# Patient Record
Sex: Female | Born: 1960 | Race: Black or African American | Hispanic: No | Marital: Married | Smoking: Never smoker
Health system: Southern US, Community
[De-identification: ages and names within clinical notes are randomized; demographics above are authoritative.]

## PROBLEM LIST (undated history)

## (undated) DIAGNOSIS — K219 Gastro-esophageal reflux disease without esophagitis: Secondary | ICD-10-CM

## (undated) DIAGNOSIS — E669 Obesity, unspecified: Secondary | ICD-10-CM

## (undated) DIAGNOSIS — E119 Type 2 diabetes mellitus without complications: Secondary | ICD-10-CM

## (undated) DIAGNOSIS — E785 Hyperlipidemia, unspecified: Secondary | ICD-10-CM

## (undated) DIAGNOSIS — I10 Essential (primary) hypertension: Secondary | ICD-10-CM

## (undated) HISTORY — DX: Obesity, unspecified: E66.9

## (undated) HISTORY — DX: Gastro-esophageal reflux disease without esophagitis: K21.9

## (undated) HISTORY — DX: Hyperlipidemia, unspecified: E78.5

## (undated) HISTORY — PX: ABDOMINAL HYSTERECTOMY: SHX81

## (undated) HISTORY — DX: Type 2 diabetes mellitus without complications: E11.9

## (undated) HISTORY — DX: Essential (primary) hypertension: I10

---

## 2004-12-16 ENCOUNTER — Ambulatory Visit: Payer: Self-pay | Admitting: Obstetrics and Gynecology

## 2005-05-21 ENCOUNTER — Ambulatory Visit: Payer: Self-pay

## 2005-05-28 ENCOUNTER — Ambulatory Visit: Payer: Self-pay

## 2005-07-13 ENCOUNTER — Ambulatory Visit: Payer: Self-pay | Admitting: Obstetrics and Gynecology

## 2006-03-12 ENCOUNTER — Ambulatory Visit: Payer: Self-pay

## 2007-01-17 ENCOUNTER — Other Ambulatory Visit: Payer: Self-pay

## 2007-01-17 ENCOUNTER — Inpatient Hospital Stay: Payer: Self-pay | Admitting: Internal Medicine

## 2009-10-09 ENCOUNTER — Other Ambulatory Visit: Payer: Self-pay | Admitting: General Practice

## 2010-06-11 ENCOUNTER — Ambulatory Visit: Payer: Self-pay | Admitting: Family Medicine

## 2011-02-11 ENCOUNTER — Ambulatory Visit: Payer: Self-pay | Admitting: General Practice

## 2012-07-13 ENCOUNTER — Other Ambulatory Visit: Payer: Self-pay | Admitting: Physician Assistant

## 2012-07-13 LAB — HEPATIC FUNCTION PANEL A (ARMC)
Albumin: 3.9 g/dL (ref 3.4–5.0)
Alkaline Phosphatase: 124 U/L (ref 50–136)
SGOT(AST): 26 U/L (ref 15–37)
SGPT (ALT): 24 U/L (ref 12–78)
Total Protein: 7.4 g/dL (ref 6.4–8.2)

## 2014-04-04 DIAGNOSIS — I10 Essential (primary) hypertension: Secondary | ICD-10-CM

## 2014-04-04 DIAGNOSIS — E669 Obesity, unspecified: Secondary | ICD-10-CM

## 2014-04-04 HISTORY — DX: Essential (primary) hypertension: I10

## 2014-04-04 HISTORY — DX: Obesity, unspecified: E66.9

## 2014-05-03 ENCOUNTER — Ambulatory Visit: Payer: Self-pay | Admitting: Family Medicine

## 2014-06-01 ENCOUNTER — Ambulatory Visit: Payer: Self-pay | Admitting: Gastroenterology

## 2014-06-05 LAB — PATHOLOGY REPORT

## 2015-06-21 ENCOUNTER — Other Ambulatory Visit: Payer: Self-pay | Admitting: Internal Medicine

## 2015-06-21 DIAGNOSIS — Z1231 Encounter for screening mammogram for malignant neoplasm of breast: Secondary | ICD-10-CM

## 2015-06-27 ENCOUNTER — Ambulatory Visit: Payer: Self-pay | Attending: Internal Medicine

## 2016-02-20 ENCOUNTER — Ambulatory Visit: Payer: Self-pay

## 2016-02-28 ENCOUNTER — Ambulatory Visit
Admission: RE | Admit: 2016-02-28 | Discharge: 2016-02-28 | Disposition: A | Discharge status: Home / Self Care | Payer: BLUE CROSS/BLUE SHIELD | Source: Ambulatory Visit | Attending: Internal Medicine | Admitting: Internal Medicine

## 2016-02-28 DIAGNOSIS — Z1231 Encounter for screening mammogram for malignant neoplasm of breast: Secondary | ICD-10-CM | POA: Diagnosis not present

## 2016-03-12 ENCOUNTER — Ambulatory Visit (INDEPENDENT_AMBULATORY_CARE_PROVIDER_SITE_OTHER): Payer: BLUE CROSS/BLUE SHIELD | Admitting: Urology

## 2016-03-12 ENCOUNTER — Encounter: Payer: Self-pay | Admitting: Urology

## 2016-03-12 VITALS — BP 137/82 | HR 72 | Ht 64.0 in | Wt 195.5 lb

## 2016-03-12 DIAGNOSIS — R3129 Other microscopic hematuria: Secondary | ICD-10-CM | POA: Diagnosis not present

## 2016-03-12 NOTE — Progress Notes (Signed)
03/12/2016 2:44 PM   Christie Cox 1961-01-30 [id number]  Referring provider: No referring provider defined for this encounter.  Chief Complaint  Patient presents with  . New Patient (Initial Visit)    microscopic hematuria    HPI: The patient is a 55 year old female presents for evaluation of microscopic hematuria. She had 4-10 red blood cells per high-power field on recent urinalysis at her PCPs office. It is also present on today's urinalysis. She denies any history prior to this of microhematuria or gross hematuria. She is no history of nephrolithiasis. She has never smoked. She denies any issues with voiding.   PMH: Past Medical History  Diagnosis Date  . BP (high blood pressure) 04/04/2014  . Adiposity 04/04/2014  . Acid reflux     Surgical History: No past surgical history on file.  Home Medications:    Medication List       This list is accurate as of: 03/12/16  2:44 PM.  Always use your most recent med list.               losartan-hydrochlorothiazide 100-12.5 MG tablet  Commonly known as:  HYZAAR     montelukast 10 MG tablet  Commonly known as:  SINGULAIR  Take by mouth.        Allergies:  Allergies  Allergen Reactions  . Beta Adrenergic Blockers Other (See Comments)    Lack of energy  . Triamterene-Hctz     Other reaction(s): Other (See Comments) malaise    Family History: No family history on file.  Social History:  reports that she has never smoked. She does not have any smokeless tobacco history on file. She reports that she drinks alcohol. She reports that she does not use illicit drugs.  ROS: UROLOGY Frequent Urination?: Yes Hard to postpone urination?: No Burning/pain with urination?: No Get up at night to urinate?: Yes Leakage of urine?: No Urine stream starts and stops?: No Trouble starting stream?: No Do you have to strain to urinate?: No Blood in urine?: Yes Urinary tract infection?: No Sexually transmitted disease?:  No Injury to kidneys or bladder?: No Painful intercourse?: No Weak stream?: No Currently pregnant?: No Vaginal bleeding?: No Last menstrual period?: n  Gastrointestinal Nausea?: No Vomiting?: No Indigestion/heartburn?: Yes Diarrhea?: No Constipation?: No  Constitutional Fever: No Night sweats?: No Weight loss?: No Fatigue?: No  Skin Skin rash/lesions?: No Itching?: No  Eyes Blurred vision?: No Double vision?: No  Ears/Nose/Throat Sore throat?: No Sinus problems?: No  Hematologic/Lymphatic Swollen glands?: No Easy bruising?: Yes  Cardiovascular Leg swelling?: No Chest pain?: No  Respiratory Cough?: No Shortness of breath?: No  Endocrine Excessive thirst?: No  Musculoskeletal Back pain?: Yes Joint pain?: No  Neurological Headaches?: No Dizziness?: No  Psychologic Depression?: No Anxiety?: No  Physical Exam: BP 137/82 mmHg  Pulse 72  Ht  (1.626 m)  Wt 195 lb 8 oz (88.678 kg)  BMI 33.54 kg/m2  Constitutional:  Alert and oriented, No acute distress. HEENT: [place] AT, moist mucus membranes.  Trachea midline, no masses. Cardiovascular: No clubbing, cyanosis, or edema. Respiratory: Normal respiratory effort, no increased work of breathing. GI: Abdomen is soft, nontender, nondistended, no abdominal masses GU: No CVA tenderness. Skin: No rashes, bruises or suspicious lesions. Lymph: No cervical or inguinal adenopathy. Neurologic: Grossly intact, no focal deficits, moving all 4 extremities. Psychiatric: Normal mood and affect.  Laboratory Data: No results found for: WBC, HGB, HCT, MCV, PLT  No results found for: CREATININE  No results  found for: PSA  No results found for: TESTOSTERONE  No results found for: HGBA1C  Urinalysis No results found for: COLORURINE, APPEARANCEUR, LABSPEC, PHURINE, GLUCOSEU, HGBUR, BILIRUBINUR, KETONESUR, PROTEINUR, UROBILINOGEN, NITRITE, LEUKOCYTESUR   Assessment & Plan:    I discussed with the patient  possible etiologies of her microscopic hematuria which include urinary tract infection, nephrolithiasis, and tumors. We discussed the normal workup for this involved a CT urogram followed by office cystoscopy. All questions were answered and the patient elected to proceed.  1. Microscopic hematuria -CT Urogram -follow up for office cystoscopy after above    Return in about 2 weeks (around 03/26/2016) for after CT Urogram for cystoscopy.  Hildred LaserBrian James Terelle Dobler, MD  [facility] [address], [address] [place], [place] [address] [phone]

## 2016-03-25 ENCOUNTER — Telehealth: Payer: Self-pay | Admitting: Radiology

## 2016-03-25 NOTE — Telephone Encounter (Signed)
Pt called to cancel appt. Does not want to r/s. Not seeing another urologist.

## 2016-04-08 ENCOUNTER — Other Ambulatory Visit: Payer: BLUE CROSS/BLUE SHIELD

## 2016-04-22 LAB — URINALYSIS, COMPLETE
BILIRUBIN UA: NEGATIVE
Glucose, UA: NEGATIVE
KETONES UA: NEGATIVE
LEUKOCYTES UA: NEGATIVE
NITRITE UA: NEGATIVE
PH UA: 5.5 (ref 5.0–7.5)
Urobilinogen, Ur: 0.2 mg/dL (ref 0.2–1.0)

## 2016-04-22 LAB — MICROSCOPIC EXAMINATION

## 2017-09-24 ENCOUNTER — Other Ambulatory Visit: Payer: Self-pay | Admitting: Internal Medicine

## 2017-09-24 DIAGNOSIS — Z1231 Encounter for screening mammogram for malignant neoplasm of breast: Secondary | ICD-10-CM

## 2017-10-14 ENCOUNTER — Encounter: Payer: Self-pay | Admitting: Urology

## 2017-10-14 ENCOUNTER — Ambulatory Visit: Payer: BLUE CROSS/BLUE SHIELD | Admitting: Urology

## 2017-10-14 VITALS — BP 154/97 | HR 79 | Ht 64.0 in | Wt 194.8 lb

## 2017-10-14 DIAGNOSIS — R3129 Other microscopic hematuria: Secondary | ICD-10-CM | POA: Diagnosis not present

## 2017-10-14 LAB — URINALYSIS, COMPLETE
BILIRUBIN UA: NEGATIVE
Glucose, UA: NEGATIVE
KETONES UA: NEGATIVE
LEUKOCYTES UA: NEGATIVE
Nitrite, UA: NEGATIVE
SPEC GRAV UA: 1.025 (ref 1.005–1.030)
Urobilinogen, Ur: 0.2 mg/dL (ref 0.2–1.0)
pH, UA: 5.5 (ref 5.0–7.5)

## 2017-10-14 LAB — MICROSCOPIC EXAMINATION: WBC, UA: NONE SEEN /hpf (ref 0–?)

## 2017-10-14 NOTE — Progress Notes (Signed)
10/14/2017 3:55 PM   Christie Cox 04/03/1961 [id number]  Referring provider: Barbette Reichmann, MD [address] [facility] [place], [place] [address]  Chief Complaint  Patient presents with  . Hematuria    HPI: The patient is a 57 year old female presents for repeat evaluation of microscopic hematuria.  She was originally seen for this in June 2017 where microscopic hematuria workup with CT and bone scan was recommended.  The patient decided not to follow-up for this.  She returns today with persistent microscopic hematuria.  She continues to have microscopic hematuria now and at least 4 samples with the most recent being in December 2018.  She denies any history of microhematuria or gross hematuria prior to her first visit. She is no history of nephrolithiasis. She has never smoked. She denies any issues with voiding.     PMH: Past Medical History:  Diagnosis Date  . Acid reflux   . Adiposity 04/04/2014  . BP (high blood pressure) 04/04/2014    Surgical History: None  Home Medications:  Allergies as of 10/14/2017      Reactions   Beta Adrenergic Blockers Other (See Comments)   Lack of energy   Triamterene-hctz    Other reaction(s): Other (See Comments) malaise      Medication List        Accurate as of 10/14/17  3:55 PM. Always use your most recent med list.          DENAVIR 1 % cream Generic drug:  penciclovir Apply topically.   fenofibrate 48 MG tablet Commonly known as:  TRICOR Take by mouth.   losartan-hydrochlorothiazide 100-12.5 MG tablet Commonly known as:  HYZAAR   montelukast 10 MG tablet Commonly known as:  SINGULAIR Take by mouth.   omeprazole 20 MG capsule Commonly known as:  PRILOSEC Take by mouth.   PROVENTIL HFA 108 (90 Base) MCG/ACT inhaler Generic drug:  albuterol Inhale into the lungs.       Allergies:  Allergies  Allergen Reactions  . Beta Adrenergic Blockers Other (See Comments)    Lack of energy  .  Triamterene-Hctz     Other reaction(s): Other (See Comments) malaise    Family History: Family History  Problem Relation Age of Onset  . Bladder Cancer Neg Hx   . Kidney cancer Neg Hx     Social History:  reports that  has never smoked. she has never used smokeless tobacco. She reports that she drinks alcohol. She reports that she does not use drugs.  ROS: UROLOGY Frequent Urination?: No Hard to postpone urination?: No Burning/pain with urination?: No Get up at night to urinate?: No Leakage of urine?: No Urine stream starts and stops?: No Trouble starting stream?: No Do you have to strain to urinate?: No Blood in urine?: Yes Urinary tract infection?: No Sexually transmitted disease?: No Injury to kidneys or bladder?: No Painful intercourse?: No Weak stream?: No Currently pregnant?: No Vaginal bleeding?: No Last menstrual period?: n  Gastrointestinal Nausea?: No Vomiting?: No Indigestion/heartburn?: No Diarrhea?: No Constipation?: No  Constitutional Fever: No Night sweats?: No Weight loss?: No Fatigue?: No  Skin Skin rash/lesions?: No Itching?: No  Eyes Blurred vision?: No Double vision?: No  Ears/Nose/Throat Sore throat?: No Sinus problems?: No  Hematologic/Lymphatic Swollen glands?: No Easy bruising?: No  Cardiovascular Leg swelling?: No Chest pain?: No  Respiratory Cough?: No Shortness of breath?: No  Endocrine Excessive thirst?: No  Musculoskeletal Back pain?: No Joint pain?: No  Neurological Headaches?: No Dizziness?: No  Psychologic Depression?: No Anxiety?: No  Physical Exam: BP (!) 154/97 (BP Location: Right Arm, Patient Position: Sitting, Cuff Size: Large)   Pulse 79   Ht 5\' 4"  (1.626 m)   Wt 194 lb 12.8 oz (88.4 kg)   BMI 33.44 kg/m   Constitutional:  Alert and oriented, No acute distress. HEENT: [place] AT, moist mucus membranes.  Trachea midline, no masses. Cardiovascular: No clubbing, cyanosis, or  edema. Respiratory: Normal respiratory effort, no increased work of breathing. GI: Abdomen is soft, nontender, nondistended, no abdominal masses GU: No CVA tenderness.  Skin: No rashes, bruises or suspicious lesions. Lymph: No cervical or inguinal adenopathy. Neurologic: Grossly intact, no focal deficits, moving all 4 extremities. Psychiatric: Normal mood and affect.  Laboratory Data: No results found for: WBC, HGB, HCT, MCV, PLT  No results found for: CREATININE  No results found for: PSA  No results found for: TESTOSTERONE  No results found for: HGBA1C  Urinalysis    Component Value Date/Time   APPEARANCEUR Clear 03/12/2016 1424   GLUCOSEU Negative 03/12/2016 1424   BILIRUBINUR Negative 03/12/2016 1424   PROTEINUR Trace (A) 03/12/2016 1424   NITRITE Negative 03/12/2016 1424   LEUKOCYTESUR Negative 03/12/2016 1424    Assessment & Plan:    1.  I again discussed the patient the importance of undergoing a true microscopic hematuria workup in the American urological Association guidelines for this which she clearly meets.  I have strongly urged her to continue follow-up this time with CT urogram followed by office cystoscopy.  Return for Cysto after CT urogram.  Hildred LaserBrian James Yolette Hastings, MD  [facility] [address], [address] [place], [place] [address] [phone]

## 2017-10-29 ENCOUNTER — Other Ambulatory Visit: Payer: BLUE CROSS/BLUE SHIELD

## 2017-11-01 ENCOUNTER — Ambulatory Visit
Admission: RE | Admit: 2017-11-01 | Discharge: 2017-11-01 | Disposition: A | Discharge status: Home / Self Care | Payer: BLUE CROSS/BLUE SHIELD | Source: Ambulatory Visit | Attending: Internal Medicine | Admitting: Internal Medicine

## 2017-11-01 DIAGNOSIS — Z1231 Encounter for screening mammogram for malignant neoplasm of breast: Secondary | ICD-10-CM | POA: Insufficient documentation

## 2017-11-04 ENCOUNTER — Ambulatory Visit
Admission: RE | Admit: 2017-11-04 | Discharge: 2017-11-04 | Disposition: A | Discharge status: Home / Self Care | Payer: BLUE CROSS/BLUE SHIELD | Source: Ambulatory Visit | Attending: Urology | Admitting: Urology

## 2017-11-04 DIAGNOSIS — K760 Fatty (change of) liver, not elsewhere classified: Secondary | ICD-10-CM | POA: Insufficient documentation

## 2017-11-04 DIAGNOSIS — K449 Diaphragmatic hernia without obstruction or gangrene: Secondary | ICD-10-CM | POA: Diagnosis not present

## 2017-11-04 DIAGNOSIS — I2510 Atherosclerotic heart disease of native coronary artery without angina pectoris: Secondary | ICD-10-CM | POA: Diagnosis not present

## 2017-11-04 DIAGNOSIS — R3129 Other microscopic hematuria: Secondary | ICD-10-CM | POA: Insufficient documentation

## 2017-11-04 DIAGNOSIS — Z90710 Acquired absence of both cervix and uterus: Secondary | ICD-10-CM | POA: Insufficient documentation

## 2017-11-04 DIAGNOSIS — K5730 Diverticulosis of large intestine without perforation or abscess without bleeding: Secondary | ICD-10-CM | POA: Insufficient documentation

## 2017-11-04 DIAGNOSIS — I700 Atherosclerosis of aorta: Secondary | ICD-10-CM | POA: Diagnosis not present

## 2017-11-04 MED ORDER — IOPAMIDOL (ISOVUE-300) INJECTION 61%
125.0000 mL | Freq: Once | INTRAVENOUS | Status: AC | PRN
  Administered 2017-11-04: 125 mL via INTRAVENOUS

## 2017-11-11 ENCOUNTER — Ambulatory Visit (INDEPENDENT_AMBULATORY_CARE_PROVIDER_SITE_OTHER): Payer: BLUE CROSS/BLUE SHIELD | Admitting: Urology

## 2017-11-11 ENCOUNTER — Encounter: Payer: Self-pay | Admitting: Urology

## 2017-11-11 VITALS — BP 133/89 | HR 80 | Ht 64.0 in | Wt 193.6 lb

## 2017-11-11 DIAGNOSIS — R3129 Other microscopic hematuria: Secondary | ICD-10-CM | POA: Diagnosis not present

## 2017-11-11 LAB — URINALYSIS, COMPLETE
BILIRUBIN UA: NEGATIVE
GLUCOSE, UA: NEGATIVE
KETONES UA: NEGATIVE
LEUKOCYTES UA: NEGATIVE
Nitrite, UA: NEGATIVE
SPEC GRAV UA: 1.025 (ref 1.005–1.030)
Urobilinogen, Ur: 0.2 mg/dL (ref 0.2–1.0)
pH, UA: 5.5 (ref 5.0–7.5)

## 2017-11-11 LAB — MICROSCOPIC EXAMINATION

## 2017-11-11 MED ORDER — LIDOCAINE HCL 2 % EX GEL
1.0000 "application " | Freq: Once | CUTANEOUS | Status: AC
  Administered 2017-11-11: 1 via URETHRAL

## 2017-11-11 MED ORDER — CIPROFLOXACIN HCL 500 MG PO TABS
500.0000 mg | ORAL_TABLET | Freq: Once | ORAL | Status: AC
  Administered 2017-11-11: 500 mg via ORAL

## 2017-11-11 NOTE — Progress Notes (Signed)
   11/11/17  CC:  Chief Complaint  Patient presents with  . Cysto    HPI: The patient is a 57 year old female presents today for completion of her microscopic hematuria workup.  Her CT hematuria workup was negative for source of hematuria.  There were no vitals taken for this visit. NED. A&Ox3.   No respiratory distress   Abd soft, NT, ND Normal external genitalia with patent urethral meatus  Cystoscopy Procedure Note  Patient identification was confirmed, informed consent was obtained, and patient was prepped using Betadine solution.  Lidocaine jelly was administered per urethral meatus.    Preoperative abx where received prior to procedure.    Procedure: - Flexible cystoscope introduced, without any difficulty.   - Thorough search of the bladder revealed:    normal urethral meatus    normal urothelium    no stones    no ulcers     no tumors    no urethral polyps    no trabeculation  - Ureteral orifices were normal in position and appearance.  Post-Procedure: - Patient tolerated the procedure well  Assessment/ Plan:  1.  Microscopic hematuria Negative workup.  Follow-up in 1 year for repeat urinalysis.  Hildred LaserBrian James Sacred Roa, MD

## 2018-06-28 ENCOUNTER — Other Ambulatory Visit: Payer: BLUE CROSS/BLUE SHIELD

## 2018-06-28 NOTE — H&P (Signed)
Christie Cox is a 57 y.o. female here for Discuss surgery .pt is new pt and is concerned about falling tissue for 3-4 months .  G3P3 SVD . + sexually active . No urination or Bowel issues . S/P [facility] 2006 for fibroid / bleeding      Past Medical History:  has a past medical history of Acute stress reaction, Benign hypertension, Obesity, unspecified, and Polyp of colon (06/01/14).  Past Surgical History:  has a past surgical history that includes Colonoscopy (06/01/14); LSH still has ovaries; and Hysterectomy (2005). Family History: family history includes ESRD-Dialysis in her sister; Glaucoma in her mother; Heart disease in her father and mother; High blood pressure (Hypertension) in her sister, sister, and sister; Rheumatic fever in her father. Social History:  reports that she has never smoked. She has never used smokeless tobacco. She reports that she drinks alcohol. She reports that she does not use drugs. OB/GYN History:          OB History    Gravida  3   Para  3   Term      Preterm      AB      Living  3     SAB      TAB      Ectopic      Molar      Multiple      Live Births  3          Allergies: is allergic to beta-blockers (beta-adrenergic blocking agts) and dyazide [triamterene-hydrochlorothiazid]. Medications:  Current Outpatient Medications:  .  albuterol 90 mcg/actuation inhaler, Inhale 2 inhalations into the lungs every 6 (six) hours as needed for Wheezing, Disp: 1 Inhaler, Rfl: 5 .  fenofibrate nanocrystallized (TRICOR) 48 MG tablet, Take 1 tablet (48 mg total) by mouth once daily, Disp: 30 tablet, Rfl: 5 .  losartan-hydrochlorothiazide (HYZAAR) 100-12.5 mg tablet, Take 1 tablet by mouth once daily, Disp: 90 tablet, Rfl: 3 .  montelukast (SINGULAIR) 10 mg tablet, Take 1 tablet (10 mg total) by mouth nightly, Disp: 90 tablet, Rfl: 3 .  nystatin-triamcinolone ointment, Apply topically 2 (two) times daily, Disp: 30 g, Rfl: 2 .  omeprazole  (PRILOSEC) 20 MG DR capsule, Take 1 capsule (20 mg total) by mouth 2 (two) times daily, Disp: 180 capsule, Rfl: 3 .  penciclovir (DENAVIR) 1 % cream, Apply 1 Application topically every 2 (two) hours while awake, Disp: 5 g, Rfl: 3 .  Compound Medication, Estriol 1 mg/g Use 1/4 app per vagina 3 times weekly Disp 30 g tube with 2 rf  Called to [facility] (Patient not taking: Reported on 05/02/2018 ), Disp: 1 each, Rfl: 2  Review of Systems: General:                      No fatigue or weight loss Eyes:                           No vision changes Ears:                            No hearing difficulty Respiratory:                No cough or shortness of breath Pulmonary:                  No asthma or shortness of breath Cardiovascular:  No chest pain, palpitations, dyspnea on exertion Gastrointestinal:          No abdominal bloating, chronic diarrhea, constipations, masses, pain or hematochezia Genitourinary:             No hematuria, dysuria, abnormal vaginal discharge, pelvic pain, Menometrorrhagia Lymphatic:                   No swollen lymph nodes Musculoskeletal:         No muscle weakness Neurologic:                  No extremity weakness, syncope, seizure disorder Psychiatric:                  No history of depression, delusions or suicidal/homicidal ideation    Exam:      Vitals:   06/29/18  BP: 121/78  Pulse: 83    Body mass index is 33.13 kg/m.  WDWN black female in NAD  Lungs: CTA  CV: RRR without murmur  Breast:exam done in sitting and lying position : No dimpling or retraction, no dominant mass, no spontaneous discharge, no axillary adenopathy Neck: no thyromegaly Abdomen: soft , no mass, normal active bowel sounds, non-tender, no rebound tenderness Pelvic: tanner stage 5 ,  External genitalia: vulva /labia no lesions Urethra: no prolapse Vagina: normal physiologic d/c, second degree cystocele and second degree rectocele with valsalva . Cx with  second degree descensus with valsalva Cervix: no lesions, no cervical motion tenderness  Uterus:absent Adnexa:no mass, non-tender  Rectovaginal:    Impression:   The primary encounter diagnosis was Cystocele, midline. Diagnoses of Rectocele and S/p partial hysterectomy with remaining cervical stump were also pertinent to this visit.    Plan:   After repeat discussion the patient elects for L/S assisted trachelectomy , and anterior / posterior repair . Bilateral salpingectomy . She has been counseled regarding the risk of the procedure     Return if symptoms worsen or fail to improve, for preop.  Vilma PraderHOMAS JANSE Christie Slutsky, MD

## 2018-06-29 ENCOUNTER — Other Ambulatory Visit: Payer: Self-pay

## 2018-06-29 ENCOUNTER — Encounter
Admission: RE | Admit: 2018-06-29 | Discharge: 2018-06-29 | Disposition: A | Discharge status: Home / Self Care | Payer: BLUE CROSS/BLUE SHIELD | Source: Ambulatory Visit | Attending: Obstetrics and Gynecology | Admitting: Obstetrics and Gynecology

## 2018-06-29 DIAGNOSIS — I10 Essential (primary) hypertension: Secondary | ICD-10-CM | POA: Insufficient documentation

## 2018-06-29 DIAGNOSIS — Z01818 Encounter for other preprocedural examination: Secondary | ICD-10-CM | POA: Diagnosis not present

## 2018-06-29 DIAGNOSIS — R9431 Abnormal electrocardiogram [ECG] [EKG]: Secondary | ICD-10-CM | POA: Diagnosis not present

## 2018-06-29 LAB — CBC
HEMATOCRIT: 40.7 % (ref 35.0–47.0)
HEMOGLOBIN: 14.4 g/dL (ref 12.0–16.0)
MCH: 34.2 pg — AB (ref 26.0–34.0)
MCHC: 35.5 g/dL (ref 32.0–36.0)
MCV: 96.5 fL (ref 80.0–100.0)
Platelets: 173 10*3/uL (ref 150–440)
RBC: 4.22 MIL/uL (ref 3.80–5.20)
RDW: 12.9 % (ref 11.5–14.5)
WBC: 3.3 10*3/uL — AB (ref 3.6–11.0)

## 2018-06-29 LAB — BASIC METABOLIC PANEL
Anion gap: 13 (ref 5–15)
BUN: 12 mg/dL (ref 6–20)
CHLORIDE: 98 mmol/L (ref 98–111)
CO2: 25 mmol/L (ref 22–32)
Calcium: 9.3 mg/dL (ref 8.9–10.3)
Creatinine, Ser: 0.62 mg/dL (ref 0.44–1.00)
GFR calc non Af Amer: >60 mL/min (ref >60)
Glucose, Bld: 341 mg/dL — ABNORMAL HIGH (ref 70–99)
POTASSIUM: UNDETERMINED mmol/L (ref 3.5–5.1)
SODIUM: 136 mmol/L (ref 135–145)

## 2018-06-29 NOTE — Pre-Procedure Instructions (Signed)
FAXED LABS WITH NEED FOR REDRAW OF KT AND OPTIMIZATION OF GLU PRIOR TO SURGERY TO DR HANDE. AND FYI TO DR Feliberto GottronSCHERMERHORN. ALSO CALLED TO [facility] AND SPOKE WITH APRIL

## 2018-06-29 NOTE — Pre-Procedure Instructions (Signed)
AS REQUESTED, FAXED EKG TO DR HANDE WITH REQUEST TO CLEAR. FYI TO DR Feliberto GottronSCHERMERHORN

## 2018-06-29 NOTE — Pre-Procedure Instructions (Signed)
Patient's BP was 182/118 in PAT taken both electronic and manually. She stated it was the same in Dr. Francesca OmanSchermerhorn's office this morning. She has an appointment with Dr. Marcello FennelHande this afternoon at 3:00pm to discuss treatment.

## 2018-06-29 NOTE — Patient Instructions (Addendum)
Your procedure is scheduled on: Monday 07/04/18  Report to DAY SURGERY DEPARTMENT LOCATED ON 2ND FLOOR MEDICAL MALL ENTRANCE. To find out your arrival time please call [phone] between 1PM - 3PM on Friday 07/01/18  Remember: Instructions that are not followed completely may result in serious medical risk, up to and including death, or upon the discretion of your surgeon and anesthesiologist your surgery may need to be rescheduled.     _X__ 1. Do not eat food after midnight the night before your procedure.                 No gum chewing or hard candies. You may drink clear liquids up to 2 hours                 before you are scheduled to arrive for your surgery- DO not drink clear                 liquids within 2 hours of the start of your surgery.                 Clear Liquids include:  water, apple juice without pulp, clear carbohydrate                 drink such as Clearfast or Gatorade, Black Coffee or Tea (Do not add                 anything to coffee or tea).  __X__2.  On the morning of surgery brush your teeth with toothpaste and water, you may rinse your mouth with mouthwash if you wish.  Do not swallow any toothpaste of mouthwash.     _X__ 3.  No Alcohol for 24 hours before or after surgery.   _X__ 4.  Do Not Smoke or use e-cigarettes For 24 Hours Prior to Your Surgery.                 Do not use any chewable tobacco products for at least 6 hours prior to                 surgery.  ____  5.  Bring all medications with you on the day of surgery if instructed.   __X__  6.  Notify your doctor if there is any change in your medical condition      (cold, fever, infections).     Do not wear jewelry, make-up, hairpins, clips or nail polish. Do not wear lotions, powders, or perfumes. You may wear deodorant. Do not shave 48 hours prior to surgery. Men may shave face and neck. Do not bring valuables to the hospital.    [facility] is not responsible for any belongings or  valuables.  Contacts, dentures/partials or body piercings may not be worn into surgery. Bring a case for your contacts, glasses or hearing aids, a denture cup will be supplied. Leave your suitcase in the car. After surgery it may be brought to your room. For patients admitted to the hospital, discharge time is determined by your treatment team.   Patients discharged the day of surgery will not be allowed to drive home.   Please read over the following fact sheets that you were given:   MRSA Information  __X__ Take these medicines the morning of surgery with A SIP OF WATER:     1. omeprazole (PRILOSEC) 20 MG capsule  2. albuterol (PROVENTIL HFA) 108 (90 Base) MCG/ACT inhaler  3.   4.  5.  6.  __X__ Fleet Enema (as directed). Administer the enema at least 1 hour prior to leaving your home for the hospital.   __X__ Use CHG Soap as directed.  _ X___ Use inhalers on the day of surgery. Also bring the inhaler with you to the hospital on the morning of surgery.  __X__ Stop Anti-inflammatories 7 days before surgery such as Advil, Ibuprofen, Motrin, BC or Goodies Powder, Naprosyn, Naproxen, Aleve, Aspirin, Meloxicam. May take Tylenol if needed for pain or discomfort.   __X__ Stop all herbal supplements, fish oil or vitamin E until after surgery.

## 2018-06-30 LAB — TYPE AND SCREEN
ABO/RH(D): A POS
Antibody Screen: POSITIVE
PT AG TYPE: NEGATIVE

## 2018-06-30 NOTE — Pre-Procedure Instructions (Signed)
Kt 4.3 at pcp office 06/29/18

## 2018-07-04 ENCOUNTER — Ambulatory Visit
Admission: RE | Admit: 2018-07-04 | Payer: BLUE CROSS/BLUE SHIELD | Source: Ambulatory Visit | Admitting: Obstetrics and Gynecology

## 2018-07-04 ENCOUNTER — Encounter: Admission: RE | Payer: Self-pay | Source: Ambulatory Visit

## 2018-07-04 SURGERY — TRACHELECTOMY
Anesthesia: General

## 2018-07-15 ENCOUNTER — Encounter: Payer: Self-pay | Admitting: *Deleted

## 2018-07-15 ENCOUNTER — Encounter: Payer: BLUE CROSS/BLUE SHIELD | Attending: Internal Medicine | Admitting: *Deleted

## 2018-07-15 VITALS — BP 130/84 | Ht 64.0 in | Wt 183.8 lb

## 2018-07-15 DIAGNOSIS — Z6831 Body mass index (BMI) 31.0-31.9, adult: Secondary | ICD-10-CM | POA: Insufficient documentation

## 2018-07-15 DIAGNOSIS — E119 Type 2 diabetes mellitus without complications: Secondary | ICD-10-CM | POA: Insufficient documentation

## 2018-07-15 DIAGNOSIS — E1165 Type 2 diabetes mellitus with hyperglycemia: Secondary | ICD-10-CM

## 2018-07-15 DIAGNOSIS — I10 Essential (primary) hypertension: Secondary | ICD-10-CM | POA: Diagnosis not present

## 2018-07-15 NOTE — Patient Instructions (Signed)
Check blood sugars 2 x day before breakfast and 2 hrs after one meal every day Bring blood sugar records to the next class  Exercise: Continue walking  for 45-60 minutes  2 days a week and gradually increase to 150 minutes/week  Eat 3 meals day, 1-2  snacks a day Space meals 4-6 hours apart Don't skip meals  Return for classes on:

## 2018-07-15 NOTE — Progress Notes (Signed)
Diabetes Self-Management Education  Visit Type: First/Initial  Appt. Start Time: 1310 Appt. End Time: 1420  07/15/2018  Ms. Christie Cox, identified by name and date of birth, is a 57 y.o. female with a diagnosis of Diabetes: Type 2.   ASSESSMENT  Blood pressure 130/84, height 5\' 4"  (1.626 m), weight 183 lb 12.8 oz (83.4 kg). Body mass index is 31.55 kg/m.  Diabetes Self-Management Education - 07/15/18 1448      Visit Information   Visit Type  First/Initial      Initial Visit   Diabetes Type  Type 2    Are you currently following a meal plan?  Yes    What type of meal plan do you follow?  low carb, low fat    Are you taking your medications as prescribed?  Yes    Date Diagnosed  2 weeks ago - but A1C was 6.5 % in 2015      Health Coping   How would you rate your overall health?  Fair      Psychosocial Assessment   Patient Belief/Attitude about Diabetes  Motivated to manage diabetes    Self-care barriers  None    Self-management support  Doctor's office;Family    Patient Concerns  Nutrition/Meal planning;Medication;Weight Control;Healthy Lifestyle;Glycemic Control    Special Needs  None    Preferred Learning Style  Visual;Auditory    Learning Readiness  Change in progress    How often do you need to have someone help you when you read instructions, pamphlets, or other written materials from your doctor or pharmacy?  1 - Never    What is the last grade level you completed in school?  4 yr college      Pre-Education Assessment   Patient understands the diabetes disease and treatment process.  Needs Review    Patient understands incorporating nutritional management into lifestyle.  Needs Review    Patient undertands incorporating physical activity into lifestyle.  Needs Review    Patient understands using medications safely.  Needs Instruction    Patient understands monitoring blood glucose, interpreting and using results  Needs Review    Patient understands prevention,  detection, and treatment of acute complications.  Needs Instruction    Patient understands prevention, detection, and treatment of chronic complications.  Needs Review    Patient understands how to develop strategies to address psychosocial issues.  Needs Instruction    Patient understands how to develop strategies to promote health/change behavior.  Needs Instruction      Complications   Last HgB A1C per patient/outside source  15.9 %   07/01/18   How often do you check your blood sugar?  1-2 times/day    Fasting Blood glucose range (mg/dL)  161-096   Pt reports FBG's 150's mg/dL.    Postprandial Blood glucose range (mg/dL)  --   Pt reports readings before supper 170 mg/dL.   Have you had a dilated eye exam in the past 12 months?  Yes    Have you had a dental exam in the past 12 months?  Yes    Are you checking your feet?  Yes    How many days per week are you checking your feet?  7      Dietary Intake   Breakfast  eggs with sausage or bacon; Greek yogurt with fruit    Lunch  grilled chicken salad    Snack (afternoon)  apple    Dinner  baked fish, chicken, beef, pork, potatoes, green beans,  broccoli, lettuce, tomatoes, cabbage, spinach, onions    Beverage(s)  water, coffee, diet soda      Exercise   Exercise Type  Light (walking / raking leaves)    How many days per week to you exercise?  2    How many minutes per day do you exercise?  45    Total minutes per week of exercise  90      Patient Education   Previous Diabetes Education  No    Disease state   Definition of diabetes, type 1 and 2, and the diagnosis of diabetes    Nutrition management   Role of diet in the treatment of diabetes and the relationship between the three main macronutrients and blood glucose level;Carbohydrate counting;Reviewed blood glucose goals for pre and post meals and how to evaluate the patients' food intake on their blood glucose level.;Food label reading, portion sizes and measuring food.;Meal timing  in regards to the patients' current diabetes medication.    Physical activity and exercise   Role of exercise on diabetes management, blood pressure control and cardiac health.    Medications  Reviewed patients medication for diabetes, action, purpose, timing of dose and side effects.    Monitoring  Purpose and frequency of SMBG.;Taught/discussed recording of test results and interpretation of SMBG.;Identified appropriate SMBG and/or A1C goals.    Chronic complications  Relationship between chronic complications and blood glucose control    Psychosocial adjustment  Identified and addressed patients feelings and concerns about diabetes      Individualized Goals (developed by patient)   Reducing Risk  Improve blood sugars Decrease medications Lose weight Lead a healthier lifestyle Become more fit     Outcomes   Expected Outcomes  Demonstrated interest in learning. Expect positive outcomes    Future DMSE  2 wks       Individualized Plan for Diabetes Self-Management Training:   Learning Objective:  Patient will have a greater understanding of diabetes self-management. Patient education plan is to attend individual and/or group sessions per assessed needs and concerns.   Plan:   Patient Instructions  Check blood sugars 2 x day before breakfast and 2 hrs after one meal every day Bring blood sugar records to the next class Exercise: Continue walking  for 45-60 minutes  2 days a week and gradually increase to 150 minutes/week Eat 3 meals day, 1-2  snacks a day Space meals 4-6 hours apart Don't skip meals  Expected Outcomes:  Demonstrated interest in learning. Expect positive outcomes  Education material provided:  General Meal Planning Guidelines Simple Meal Plan  If problems or questions, patient to contact team via:  Sharion Settler, RN, CCM, CDE [phone]  Future DSME appointment: 2 wks  July 28, 2018 for Diabetes Class 1

## 2018-07-28 ENCOUNTER — Encounter: Payer: Self-pay | Admitting: Dietician

## 2018-07-28 ENCOUNTER — Encounter: Payer: BLUE CROSS/BLUE SHIELD | Admitting: Dietician

## 2018-07-28 VITALS — Ht 64.0 in | Wt 187.0 lb

## 2018-07-28 DIAGNOSIS — E119 Type 2 diabetes mellitus without complications: Secondary | ICD-10-CM | POA: Diagnosis not present

## 2018-07-28 DIAGNOSIS — E1165 Type 2 diabetes mellitus with hyperglycemia: Secondary | ICD-10-CM

## 2018-07-28 NOTE — Progress Notes (Signed)

## 2018-08-04 ENCOUNTER — Encounter: Payer: BLUE CROSS/BLUE SHIELD | Admitting: *Deleted

## 2018-08-04 ENCOUNTER — Encounter: Payer: Self-pay | Admitting: *Deleted

## 2018-08-04 VITALS — Wt 185.1 lb

## 2018-08-04 DIAGNOSIS — E119 Type 2 diabetes mellitus without complications: Secondary | ICD-10-CM | POA: Diagnosis not present

## 2018-08-04 NOTE — Progress Notes (Signed)

## 2018-08-11 ENCOUNTER — Encounter: Payer: BLUE CROSS/BLUE SHIELD | Admitting: Dietician

## 2018-08-11 ENCOUNTER — Encounter: Payer: Self-pay | Admitting: Dietician

## 2018-08-11 VITALS — BP 120/80 | Ht 64.0 in | Wt 183.6 lb

## 2018-08-11 DIAGNOSIS — E119 Type 2 diabetes mellitus without complications: Secondary | ICD-10-CM | POA: Diagnosis not present

## 2018-08-11 NOTE — Progress Notes (Signed)

## 2018-08-16 NOTE — Pre-Procedure Instructions (Signed)
FAXED CLEARANCE REQUEST FROM 06/29/18 BACK TO DR Marcello Fennel FOR ADDITIONAL INFORMATION.

## 2018-08-17 NOTE — Pre-Procedure Instructions (Signed)
PCP CLEARANCE ON CHART

## 2018-08-23 ENCOUNTER — Encounter: Payer: Self-pay | Admitting: *Deleted

## 2018-08-23 NOTE — H&P (Signed)
Christie Cox a 57 y.o.femalehere for Discuss surgery .pt is new pt and is concerned about falling tissue for 3-4 months .  G3P3 SVD . + sexually active . No urination or Bowel issues . S/P [facility] 2006 for fibroid / bleeding  Cardiac clearance obtained by Dr Juliann Pares , cardiology     Past Medical History:has a past medical history of Acute stress reaction, Benign hypertension, Obesity, unspecified, and Polyp of colon (06/01/14). Past Surgical History:has a past surgical history that includes Colonoscopy (06/01/14); LSH still has ovaries; and Hysterectomy (2005). Family History:family history includes ESRD-Dialysis in her sister; Glaucoma in her mother; Heart disease in her father and mother; High blood pressure (Hypertension) in her sister, sister, and sister; Rheumatic fever in her father. Social History:reports that she has never smoked. She has never used smokeless tobacco. She reports that she drinks alcohol. She reports that she does not use drugs. OB/GYN History:                        OB History    Gravida  3   Para  3   Term     Preterm     AB     Living  3     SAB     TAB     Ectopic     Molar     Multiple     Live Births  3         Allergies:is allergic to beta-blockers (beta-adrenergic blocking agts) and dyazide [triamterene-hydrochlorothiazid]. Medications:  Current Outpatient Medications:  . albuterol 90 mcg/actuation inhaler, Inhale 2 inhalations into the lungs every 6 (six) hours as needed for Wheezing, Disp: 1 Inhaler, Rfl: 5 . fenofibrate nanocrystallized (TRICOR) 48 MG tablet, Take 1 tablet (48 mg total) by mouth once daily, Disp: 30 tablet, Rfl: 5 . losartan-hydrochlorothiazide (HYZAAR) 100-12.5 mg tablet, Take 1 tablet by mouth once daily, Disp: 90 tablet, Rfl: 3 . montelukast (SINGULAIR) 10 mg tablet, Take 1 tablet (10 mg total) by mouth nightly, Disp: 90 tablet, Rfl: 3 . nystatin-triamcinolone  ointment, Apply topically 2 (two) times daily, Disp: 30 g, Rfl: 2 . omeprazole (PRILOSEC) 20 MG DR capsule, Take 1 capsule (20 mg total) by mouth 2 (two) times daily, Disp: 180 capsule, Rfl: 3 . penciclovir (DENAVIR) 1 % cream, Apply 1 Application topically every 2 (two) hours while awake, Disp: 5 g, Rfl: 3 . Compound Medication, Estriol 1 mg/g Use 1/4 app per vagina 3 times weekly Disp 30 g tube with 2 rf Called to [facility] (Patient not taking: Reported on 05/02/2018 ), Disp: 1 each, Rfl: 2  Review of Systems: General: No fatigue or weightloss Eyes:No vision changes Ears:No hearing difficulty Respiratory:No cough or shortness of breath Pulmonary: No asthma or shortness of breath Cardiovascular:No chest pain, palpitations, dyspnea on exertion Gastrointestinal:No abdominal bloating, chronic diarrhea, constipations, masses, pain or hematochezia Genitourinary:No hematuria, dysuria, abnormal vaginal discharge, pelvic pain, Menometrorrhagia Lymphatic:No swollen lymph nodes Musculoskeletal:No muscle weakness Neurologic:No extremity weakness, syncope, seizure disorder Psychiatric:No history of depression, delusions or suicidal/homicidal ideation   Exam:      Vitals:     BP: 121/78  Pulse: 83    Body mass index is 33.13 kg/m.  WDWN black female in NAD  Lungs: CTA  CV: RRR without murmur  Breast:exam done in sitting and lying position : No dimpling or retraction, no dominant mass, no spontaneous discharge, no axillary adenopathy Neck: no thyromegaly Abdomen: soft , no mass, normal active bowel sounds, non-tender,  no rebound tenderness Pelvic: tanner stage 5 ,  External genitalia: vulva /labia no lesions Urethra: no prolapse Vagina: normal physiologic d/c, second  degree cystocele and second degree rectocele with valsalva . Cx with second degree descensus with valsalva Cervix: no lesions, no cervical motion tenderness  Uterus:absent Adnexa:no mass, non-tender  Rectovaginal:    Impression:   The primary encounter diagnosis was Cystocele, midline. Diagnoses of Rectocele and S/p partial hysterectomy with remaining cervical stump were also pertinent to this visit.    Plan:   After repeat discussion the patient elects for L/S assisted trachelectomy , and anterior / posterior repair . Bilateral salpingectomy . She has been counseled regarding the risk of the procedure    Return if symptoms worsen or fail to improve, for preop.  Vilma Prader, MD          Electronically signed by Suzy Bouchard, MD at 08/16/18

## 2018-08-24 NOTE — H&P (Signed)
Christie Cox is a 57 y.o. female here for Pre-op Exam . Pt is here for pre op and also c/o vaginal soreness and irritation . She started estriol 2 weeks ago and over the last several days she has c/o intense irritation .  She is scheduled for a L/S assisted cervical trachelectomy  And anterior / posterior repair for prolapse .  she has been recently cleared by Dr Marcello Fennel . Diabetes meds have been changed  Dr Juliann Pares has cleared her for surgery based on cardiac standpoint  Past Medical History:  has a past medical history of Acute stress reaction, Benign hypertension, Obesity, and Polyp of colon (06/01/14).  Past Surgical History:  has a past surgical history that includes Colonoscopy (06/01/14); LSH still has ovaries; and Hysterectomy (2005). Family History: family history includes ESRD-Dialysis in her sister; Glaucoma in her mother; Heart disease in her father and mother; High blood pressure (Hypertension) in her sister, sister, and sister; Rheumatic fever in her father. Social History:  reports that she has never smoked. She has never used smokeless tobacco. She reports that she drinks alcohol. She reports that she does not use drugs. OB/GYN History:          OB History    Gravida  3   Para  3   Term      Preterm      AB      Living  3     SAB      TAB      Ectopic      Molar      Multiple      Live Births  3          Allergies: is allergic to beta-blockers (beta-adrenergic blocking agts) and dyazide [triamterene-hydrochlorothiazid]. Medications:  Current Outpatient Medications:   Current Outpatient Medications:  .  albuterol 90 mcg/actuation inhaler, Inhale 2 inhalations into the lungs every 6 (six) hours as needed for Wheezing, Disp: 1 Inhaler, Rfl: 5 .  amLODIPine (NORVASC) 5 MG tablet, Take 1 tablet (5 mg total) by mouth once daily, Disp: 30 tablet, Rfl: 5 .  fenofibrate nanocrystallized (TRICOR) 48 MG tablet, Take 1 tablet (48 mg total) by mouth once  daily, Disp: 30 tablet, Rfl: 5 .  glimepiride (AMARYL) 2 MG tablet, Take 1 tablet (2 mg total) by mouth daily with breakfast, Disp: 30 tablet, Rfl: 11 .  losartan-hydrochlorothiazide (HYZAAR) 100-12.5 mg tablet, Take 1 tablet by mouth once daily, Disp: 30 tablet, Rfl: 11 .  metFORMIN (GLUCOPHAGE) 500 MG tablet, Take 1 tablet (500 mg total) by mouth 2 (two) times daily with meals, Disp: 60 tablet, Rfl: 5 .  omeprazole (PRILOSEC) 20 MG DR capsule, Take 1 capsule (20 mg total) by mouth 2 (two) times daily, Disp: 180 capsule, Rfl: 3 .  Compound Medication, Estriol 1mg /g Use 2 g per vagina three times weekly Disp 30 g tube with 2 rf Called to [employer] [facility] (Patient not taking: Reported on 08/24/2018 ), Disp: 1 each, Rfl: 2 .  nystatin-triamcinolone ointment, Apply topically 2 (two) times daily (Patient not taking: Reported on 08/24/2018 ), Disp: 30 g, Rfl: 2 .  penciclovir (DENAVIR) 1 % cream, Apply 1 Application topically every 2 (two) hours while awake (Patient not taking: Reported on 08/24/2018 ), Disp: 5 g, Rfl: 3 .  triamcinolone 0.1 % cream, Apply topically 2 (two) times daily (Patient not taking: Reported on 08/24/2018 ), Disp: 30 g, Rfl: 0  .Review of Systems: General:  No fatigue or weight loss Eyes:                           No vision changes Ears:                            No hearing difficulty Respiratory:                No cough or shortness of breath Pulmonary:                  No asthma or shortness of breath Cardiovascular:           No chest pain, palpitations, dyspnea on exertion Gastrointestinal:          No abdominal bloating, chronic diarrhea, constipations, masses, pain or hematochezia Genitourinary:             No hematuria, dysuria,  pelvic pain, Menometrorrhagia Lymphatic:                   No swollen lymph nodes Musculoskeletal:         No muscle weakness Neurologic:                  No extremity weakness, syncope, seizure disorder Psychiatric:                   No history of depression, delusions or suicidal/homicidal ideation    Exam:      Vitals:     BP: 123/87  Pulse: 74    Body mass index is 31.58 kg/m.  WDWN  black female in NAD   Lungs: CTA  CV : RRR without murmur   Vagina:white d/c wet mount : + yeast  Labial edema  Cervix: no lesions, no cervical motion tenderness   Uterus: normal size shape and contour, non-tender Adnexa: no mass,  non-tender    Impression:   The primary encounter diagnosis was Monilial vaginitis. Diagnoses of Pre-op examination, Hypertension, uncontrolled, and Leukorrhea were also pertinent to this visit.    Plan:  Benefits and risks to surgery: l/s cervical trachelectomy , anterior and posterior repair  The proposed benefit of the surgery has been discussed with the patient. The possible risks include, but are not limited to: organ injury to the bowel , bladder, ureters, and major blood vessels and nerves. There is a possibility of additional surgeries resulting from these injuries. There is also the risk of blood transfusion and the need to receive blood products during or after the procedure which may rarely lead to HIV or Hepatitis C infection. There is a risk of developing a deep venous thrombosis or a pulmonary embolism . There is the possibility of wound infection and also anesthetic complications, even the rare possibility of death. The patient understands these risks and wishes to proceed. All questions have been answered and the consent has been signed.                  Vilma PraderHOMAS JANSE SCHERMERHORN, MD       Electronically signed by Schermerhorn, Sabas Soushomas Janse, MD on 08/24/1909:10 AM

## 2018-08-25 ENCOUNTER — Ambulatory Visit: Payer: BLUE CROSS/BLUE SHIELD

## 2018-08-29 ENCOUNTER — Encounter
Admission: RE | Disposition: A | Discharge status: Home / Self Care | Payer: Self-pay | Source: Ambulatory Visit | Attending: Obstetrics and Gynecology

## 2018-08-29 ENCOUNTER — Ambulatory Visit: Payer: BLUE CROSS/BLUE SHIELD | Admitting: Registered Nurse

## 2018-08-29 ENCOUNTER — Other Ambulatory Visit: Payer: Self-pay

## 2018-08-29 ENCOUNTER — Observation Stay
Admission: RE | Admit: 2018-08-29 | Discharge: 2018-08-30 | Disposition: A | Discharge status: Home / Self Care | Payer: BLUE CROSS/BLUE SHIELD | Source: Ambulatory Visit | Attending: Obstetrics and Gynecology | Admitting: Obstetrics and Gynecology

## 2018-08-29 DIAGNOSIS — Z803 Family history of malignant neoplasm of breast: Secondary | ICD-10-CM | POA: Diagnosis not present

## 2018-08-29 DIAGNOSIS — K219 Gastro-esophageal reflux disease without esophagitis: Secondary | ICD-10-CM | POA: Diagnosis not present

## 2018-08-29 DIAGNOSIS — N8111 Cystocele, midline: Secondary | ICD-10-CM | POA: Diagnosis present

## 2018-08-29 DIAGNOSIS — Z7984 Long term (current) use of oral hypoglycemic drugs: Secondary | ICD-10-CM | POA: Insufficient documentation

## 2018-08-29 DIAGNOSIS — Z79899 Other long term (current) drug therapy: Secondary | ICD-10-CM | POA: Diagnosis not present

## 2018-08-29 DIAGNOSIS — E119 Type 2 diabetes mellitus without complications: Secondary | ICD-10-CM | POA: Diagnosis not present

## 2018-08-29 DIAGNOSIS — N816 Rectocele: Secondary | ICD-10-CM | POA: Diagnosis not present

## 2018-08-29 DIAGNOSIS — I10 Essential (primary) hypertension: Secondary | ICD-10-CM | POA: Insufficient documentation

## 2018-08-29 DIAGNOSIS — Z8041 Family history of malignant neoplasm of ovary: Secondary | ICD-10-CM | POA: Diagnosis not present

## 2018-08-29 DIAGNOSIS — N72 Inflammatory disease of cervix uteri: Secondary | ICD-10-CM | POA: Diagnosis not present

## 2018-08-29 DIAGNOSIS — N736 Female pelvic peritoneal adhesions (postinfective): Secondary | ICD-10-CM | POA: Insufficient documentation

## 2018-08-29 DIAGNOSIS — Z9889 Other specified postprocedural states: Secondary | ICD-10-CM

## 2018-08-29 HISTORY — PX: LAPAROSCOPIC BILATERAL SALPINGO OOPHERECTOMY: SHX5890

## 2018-08-29 HISTORY — PX: LAPAROSCOPIC LYSIS OF ADHESIONS: SHX5905

## 2018-08-29 HISTORY — PX: ANTERIOR AND POSTERIOR REPAIR: SHX5121

## 2018-08-29 HISTORY — PX: TRACHELECTOMY: SHX6586

## 2018-08-29 LAB — POCT I-STAT 4, (NA,K, GLUC, HGB,HCT)
GLUCOSE: 133 mg/dL — AB (ref 70–99)
HEMATOCRIT: 36 % (ref 36.0–46.0)
HEMOGLOBIN: 12.2 g/dL (ref 12.0–15.0)
Potassium: 3.8 mmol/L (ref 3.5–5.1)
Sodium: 140 mmol/L (ref 135–145)

## 2018-08-29 LAB — GLUCOSE, CAPILLARY
Glucose-Capillary: 138 mg/dL — ABNORMAL HIGH (ref 70–99)
Glucose-Capillary: 157 mg/dL — ABNORMAL HIGH (ref 70–99)
Glucose-Capillary: 171 mg/dL — ABNORMAL HIGH (ref 70–99)
Glucose-Capillary: 135 mg/dL — ABNORMAL HIGH (ref 70–99)

## 2018-08-29 SURGERY — TRACHELECTOMY
Anesthesia: General

## 2018-08-29 MED ORDER — LACTATED RINGERS IV SOLN
INTRAVENOUS | Status: DC
  Administered 2018-08-29 – 2018-08-30 (×2): via INTRAVENOUS

## 2018-08-29 MED ORDER — MORPHINE SULFATE (PF) 2 MG/ML IV SOLN
1.0000 mg | INTRAVENOUS | Status: DC | PRN
  Administered 2018-08-29 (×2): 2 mg via INTRAVENOUS
  Filled 2018-08-29 (×2): qty 1

## 2018-08-29 MED ORDER — METHYLENE BLUE 0.5 % INJ SOLN
INTRAVENOUS | Status: AC
  Filled 2018-08-29: qty 10

## 2018-08-29 MED ORDER — METHYLENE BLUE 0.5 % INJ SOLN
INTRAVENOUS | Status: DC | PRN
  Administered 2018-08-29: 1 mL

## 2018-08-29 MED ORDER — MIDAZOLAM HCL 2 MG/2ML IJ SOLN
INTRAMUSCULAR | Status: AC
  Filled 2018-08-29: qty 2

## 2018-08-29 MED ORDER — SUGAMMADEX SODIUM 200 MG/2ML IV SOLN
INTRAVENOUS | Status: AC
  Filled 2018-08-29: qty 2

## 2018-08-29 MED ORDER — SILVER NITRATE-POT NITRATE 75-25 % EX MISC
CUTANEOUS | Status: AC
  Filled 2018-08-29: qty 1

## 2018-08-29 MED ORDER — ONDANSETRON HCL 4 MG/2ML IJ SOLN: 4.0000 mg | Freq: Once | INTRAMUSCULAR | Status: DC | PRN

## 2018-08-29 MED ORDER — DEXAMETHASONE SODIUM PHOSPHATE 10 MG/ML IJ SOLN
INTRAMUSCULAR | Status: AC
  Filled 2018-08-29: qty 1

## 2018-08-29 MED ORDER — LIDOCAINE HCL (PF) 2 % IJ SOLN
INTRAMUSCULAR | Status: AC
  Filled 2018-08-29: qty 10

## 2018-08-29 MED ORDER — MIDAZOLAM HCL 2 MG/2ML IJ SOLN
INTRAMUSCULAR | Status: DC | PRN
  Administered 2018-08-29: 2 mg via INTRAVENOUS

## 2018-08-29 MED ORDER — VASOPRESSIN 20 UNIT/ML IV SOLN
INTRAVENOUS | Status: AC
  Filled 2018-08-29: qty 1

## 2018-08-29 MED ORDER — SODIUM CHLORIDE 0.9 % IV SOLN: INTRAVENOUS | Status: DC

## 2018-08-29 MED ORDER — ROCURONIUM BROMIDE 50 MG/5ML IV SOLN
INTRAVENOUS | Status: AC
  Filled 2018-08-29: qty 1

## 2018-08-29 MED ORDER — SODIUM CHLORIDE 0.9 % IV SOLN
INTRAVENOUS | Status: DC
  Administered 2018-08-29: 08:00:00 via INTRAVENOUS

## 2018-08-29 MED ORDER — SUCCINYLCHOLINE CHLORIDE 20 MG/ML IJ SOLN
INTRAMUSCULAR | Status: AC
  Filled 2018-08-29: qty 1

## 2018-08-29 MED ORDER — PROPOFOL 10 MG/ML IV BOLUS
INTRAVENOUS | Status: DC | PRN
  Administered 2018-08-29: 150 mg via INTRAVENOUS

## 2018-08-29 MED ORDER — METFORMIN HCL 500 MG PO TABS
500.0000 mg | ORAL_TABLET | Freq: Two times a day (BID) | ORAL | Status: DC
  Administered 2018-08-29 – 2018-08-30 (×2): 500 mg via ORAL
  Filled 2018-08-29 (×3): qty 1

## 2018-08-29 MED ORDER — ONDANSETRON HCL 4 MG PO TABS: 4.0000 mg | ORAL_TABLET | Freq: Four times a day (QID) | ORAL | Status: DC | PRN

## 2018-08-29 MED ORDER — ACETAMINOPHEN 10 MG/ML IV SOLN
INTRAVENOUS | Status: AC
  Filled 2018-08-29: qty 100

## 2018-08-29 MED ORDER — ONDANSETRON HCL 4 MG/2ML IJ SOLN
INTRAMUSCULAR | Status: DC | PRN
  Administered 2018-08-29: 4 mg via INTRAVENOUS

## 2018-08-29 MED ORDER — KETOROLAC TROMETHAMINE 30 MG/ML IJ SOLN
INTRAMUSCULAR | Status: AC
  Filled 2018-08-29: qty 1

## 2018-08-29 MED ORDER — OXYCODONE-ACETAMINOPHEN 5-325 MG PO TABS
1.0000 | ORAL_TABLET | ORAL | Status: DC | PRN
  Administered 2018-08-29 – 2018-08-30 (×4): 1 via ORAL
  Filled 2018-08-29 (×4): qty 1

## 2018-08-29 MED ORDER — FENTANYL CITRATE (PF) 100 MCG/2ML IJ SOLN
INTRAMUSCULAR | Status: DC | PRN
  Administered 2018-08-29: 100 ug via INTRAVENOUS
  Administered 2018-08-29: 50 ug via INTRAVENOUS

## 2018-08-29 MED ORDER — CEFAZOLIN SODIUM-DEXTROSE 2-4 GM/100ML-% IV SOLN
INTRAVENOUS | Status: AC
  Filled 2018-08-29: qty 100

## 2018-08-29 MED ORDER — ONDANSETRON HCL 4 MG/2ML IJ SOLN
INTRAMUSCULAR | Status: AC
  Filled 2018-08-29: qty 2

## 2018-08-29 MED ORDER — FLEET ENEMA 7-19 GM/118ML RE ENEM: 1.0000 | ENEMA | Freq: Once | RECTAL | Status: DC

## 2018-08-29 MED ORDER — EPHEDRINE SULFATE 50 MG/ML IJ SOLN
INTRAMUSCULAR | Status: AC
  Filled 2018-08-29: qty 2

## 2018-08-29 MED ORDER — SIMETHICONE 80 MG PO CHEW
80.0000 mg | CHEWABLE_TABLET | Freq: Four times a day (QID) | ORAL | Status: DC | PRN
  Administered 2018-08-29 – 2018-08-30 (×3): 80 mg via ORAL
  Filled 2018-08-29 (×3): qty 1

## 2018-08-29 MED ORDER — KETOROLAC TROMETHAMINE 30 MG/ML IJ SOLN
INTRAMUSCULAR | Status: DC | PRN
  Administered 2018-08-29: 30 mg via INTRAVENOUS

## 2018-08-29 MED ORDER — FENTANYL CITRATE (PF) 100 MCG/2ML IJ SOLN
25.0000 ug | INTRAMUSCULAR | Status: DC | PRN
  Administered 2018-08-29 (×3): 25 ug via INTRAVENOUS

## 2018-08-29 MED ORDER — LIDOCAINE HCL (CARDIAC) PF 100 MG/5ML IV SOSY
PREFILLED_SYRINGE | INTRAVENOUS | Status: DC | PRN
  Administered 2018-08-29: 80 mg via INTRAVENOUS

## 2018-08-29 MED ORDER — ACETAMINOPHEN 10 MG/ML IV SOLN
INTRAVENOUS | Status: DC | PRN
  Administered 2018-08-29: 1000 mg via INTRAVENOUS

## 2018-08-29 MED ORDER — ESTROGENS, CONJUGATED 0.625 MG/GM VA CREA
TOPICAL_CREAM | VAGINAL | Status: DC | PRN
  Administered 2018-08-29: 1 via VAGINAL

## 2018-08-29 MED ORDER — LIDOCAINE-EPINEPHRINE 1 %-1:100000 IJ SOLN
INTRAMUSCULAR | Status: AC
  Filled 2018-08-29: qty 1

## 2018-08-29 MED ORDER — IBUPROFEN 600 MG PO TABS
600.0000 mg | ORAL_TABLET | Freq: Four times a day (QID) | ORAL | Status: DC | PRN
  Administered 2018-08-29 – 2018-08-30 (×2): 600 mg via ORAL
  Filled 2018-08-29 (×2): qty 1

## 2018-08-29 MED ORDER — FENTANYL CITRATE (PF) 100 MCG/2ML IJ SOLN
INTRAMUSCULAR | Status: AC
  Administered 2018-08-29: 25 ug via INTRAVENOUS
  Filled 2018-08-29: qty 2

## 2018-08-29 MED ORDER — ROCURONIUM BROMIDE 100 MG/10ML IV SOLN
INTRAVENOUS | Status: DC | PRN
  Administered 2018-08-29 (×2): 10 mg via INTRAVENOUS
  Administered 2018-08-29: 40 mg via INTRAVENOUS
  Administered 2018-08-29: 10 mg via INTRAVENOUS

## 2018-08-29 MED ORDER — SUGAMMADEX SODIUM 200 MG/2ML IV SOLN
INTRAVENOUS | Status: DC | PRN
  Administered 2018-08-29: 200 mg via INTRAVENOUS

## 2018-08-29 MED ORDER — FENTANYL CITRATE (PF) 250 MCG/5ML IJ SOLN
INTRAMUSCULAR | Status: AC
  Filled 2018-08-29: qty 5

## 2018-08-29 MED ORDER — ESTROGENS, CONJUGATED 0.625 MG/GM VA CREA
TOPICAL_CREAM | VAGINAL | Status: AC
  Filled 2018-08-29: qty 30

## 2018-08-29 MED ORDER — PROPOFOL 10 MG/ML IV BOLUS
INTRAVENOUS | Status: AC
  Filled 2018-08-29: qty 20

## 2018-08-29 MED ORDER — CEFAZOLIN SODIUM-DEXTROSE 2-4 GM/100ML-% IV SOLN
2.0000 g | Freq: Once | INTRAVENOUS | Status: AC
  Administered 2018-08-29: 2 g via INTRAVENOUS

## 2018-08-29 MED ORDER — BUPIVACAINE HCL (PF) 0.5 % IJ SOLN
INTRAMUSCULAR | Status: AC
  Filled 2018-08-29: qty 30

## 2018-08-29 MED ORDER — PHENYLEPHRINE HCL 10 MG/ML IJ SOLN
INTRAMUSCULAR | Status: DC | PRN
  Administered 2018-08-29 (×3): 100 ug via INTRAVENOUS

## 2018-08-29 MED ORDER — LIDOCAINE-EPINEPHRINE 1 %-1:100000 IJ SOLN
INTRAMUSCULAR | Status: DC | PRN
  Administered 2018-08-29: 30 mL

## 2018-08-29 MED ORDER — BUPIVACAINE HCL 0.5 % IJ SOLN
INTRAMUSCULAR | Status: DC | PRN
  Administered 2018-08-29: 10 mL

## 2018-08-29 MED ORDER — PHENYLEPHRINE HCL 10 MG/ML IJ SOLN
INTRAMUSCULAR | Status: AC
  Filled 2018-08-29: qty 1

## 2018-08-29 MED ORDER — ONDANSETRON HCL 4 MG/2ML IJ SOLN: 4.0000 mg | Freq: Four times a day (QID) | INTRAMUSCULAR | Status: DC | PRN

## 2018-08-29 MED ORDER — AMLODIPINE BESYLATE 5 MG PO TABS
5.0000 mg | ORAL_TABLET | Freq: Every day | ORAL | Status: DC
  Administered 2018-08-30: 5 mg via ORAL
  Filled 2018-08-29: qty 1

## 2018-08-29 MED ORDER — DEXAMETHASONE SODIUM PHOSPHATE 10 MG/ML IJ SOLN
INTRAMUSCULAR | Status: DC | PRN
  Administered 2018-08-29: 5 mg via INTRAVENOUS

## 2018-08-29 SURGICAL SUPPLY — 48 items
BAG URINE DRAINAGE (UROLOGICAL SUPPLIES) ×5 IMPLANT
BLADE SURG SZ10 CARB STEEL (BLADE) ×5 IMPLANT
BNDG GAUZE 4.5X4.1 6PLY STRL (MISCELLANEOUS) ×5 IMPLANT
CANISTER SUCT 1200ML W/VALVE (MISCELLANEOUS) ×5 IMPLANT
CATH FOLEY 2WAY  5CC 16FR (CATHETERS) ×2
CATH ROBINSON RED A/P 16FR (CATHETERS) ×5 IMPLANT
CATH URTH 16FR FL 2W BLN LF (CATHETERS) ×3 IMPLANT
COVER WAND RF STERILE (DRAPES) ×5 IMPLANT
DRAPE PERI LITHO V/GYN (MISCELLANEOUS) ×5 IMPLANT
DRAPE SHEET LG 3/4 BI-LAMINATE (DRAPES) ×5 IMPLANT
DRAPE SURG 17X11 SM STRL (DRAPES) ×5 IMPLANT
DRAPE UNDER BUTTOCK W/FLU (DRAPES) ×5 IMPLANT
ELECT REM PT RETURN 9FT ADLT (ELECTROSURGICAL) ×5
ELECTRODE REM PT RTRN 9FT ADLT (ELECTROSURGICAL) ×3 IMPLANT
GAUZE 4X4 16PLY RFD (DISPOSABLE) ×7 IMPLANT
GAUZE PACK 2X3YD (MISCELLANEOUS) ×5 IMPLANT
GLOVE BIO SURGEON STRL SZ8 (GLOVE) ×5 IMPLANT
GOWN STRL REUS W/ TWL LRG LVL3 (GOWN DISPOSABLE) ×9 IMPLANT
GOWN STRL REUS W/ TWL XL LVL3 (GOWN DISPOSABLE) ×3 IMPLANT
GOWN STRL REUS W/TWL LRG LVL3 (GOWN DISPOSABLE) ×6
GOWN STRL REUS W/TWL XL LVL3 (GOWN DISPOSABLE) ×2
IRRIGATION STRYKERFLOW (MISCELLANEOUS) IMPLANT
IRRIGATOR STRYKERFLOW (MISCELLANEOUS) ×5
KIT TURNOVER CYSTO (KITS) ×5 IMPLANT
KIT TURNOVER KIT A (KITS) ×5 IMPLANT
LABEL OR SOLS (LABEL) ×5 IMPLANT
NDL SAFETY ECLIPSE 18X1.5 (NEEDLE) ×3 IMPLANT
NEEDLE HYPO 18GX1.5 SHARP (NEEDLE) ×2
NEEDLE HYPO 22GX1.5 SAFETY (NEEDLE) ×5 IMPLANT
NS IRRIG 500ML POUR BTL (IV SOLUTION) ×5 IMPLANT
PACK BASIN MINOR ARMC (MISCELLANEOUS) ×5 IMPLANT
PAD OB MATERNITY 4.3X12.25 (PERSONAL CARE ITEMS) ×5 IMPLANT
PAD PREP 24X41 OB/GYN DISP (PERSONAL CARE ITEMS) ×5 IMPLANT
SHEARS HARMONIC ACE PLUS 45CM (MISCELLANEOUS) ×2 IMPLANT
SUT ETHIBOND 3 0 SH 1 (SUTURE) ×2 IMPLANT
SUT PDS AB 2-0 CT1 27 (SUTURE) ×5 IMPLANT
SUT VIC AB 0 CT1 27 (SUTURE) ×4
SUT VIC AB 0 CT1 27XCR 8 STRN (SUTURE) ×6 IMPLANT
SUT VIC AB 0 CT1 36 (SUTURE) ×5 IMPLANT
SUT VIC AB 2-0 CT1 36 (SUTURE) ×10 IMPLANT
SUT VIC AB 2-0 SH 27 (SUTURE) ×10
SUT VIC AB 2-0 SH 27XBRD (SUTURE) ×9 IMPLANT
SUT VIC AB 3-0 SH 27 (SUTURE) ×4
SUT VIC AB 3-0 SH 27X BRD (SUTURE) ×3 IMPLANT
SYR 10ML LL (SYRINGE) ×5 IMPLANT
SYR 30ML LL (SYRINGE) ×5 IMPLANT
SYR CONTROL 10ML (SYRINGE) ×5 IMPLANT
TROCAR XCEL NON-BLD 5MMX100MML (ENDOMECHANICALS) ×6 IMPLANT

## 2018-08-29 NOTE — Anesthesia Preprocedure Evaluation (Signed)
Anesthesia Evaluation  Patient identified by MRN, date of birth, ID band Patient awake    Reviewed: Allergy & Precautions, NPO status , Patient's Chart, lab work & pertinent test results  Airway Mallampati: III  TM Distance: >3 FB   Mouth opening: Limited Mouth Opening  Dental  (+) Dental Advisory Given   Pulmonary neg pulmonary ROS,    Pulmonary exam normal        Cardiovascular hypertension, Pt. on medications Normal cardiovascular exam     Neuro/Psych negative neurological ROS  negative psych ROS   GI/Hepatic Neg liver ROS, GERD  Medicated,  Endo/Other  diabetes, Well Controlled, Type 2, Oral Hypoglycemic Agents  Renal/GU negative Renal ROS  Female GU complaint     Musculoskeletal negative musculoskeletal ROS (+)   Abdominal Normal abdominal exam  (+)   Peds negative pediatric ROS (+)  Hematology negative hematology ROS (+)   Anesthesia Other Findings   Reproductive/Obstetrics                             Anesthesia Physical Anesthesia Plan  ASA: II  Anesthesia Plan: General   Post-op Pain Management:    Induction: Intravenous  PONV Risk Score and Plan:   Airway Management Planned: Oral ETT  Additional Equipment:   Intra-op Plan:   Post-operative Plan: Extubation in OR  Informed Consent: I have reviewed the patients History and Physical, chart, labs and discussed the procedure including the risks, benefits and alternatives for the proposed anesthesia with the patient or authorized representative who has indicated his/her understanding and acceptance.   Dental advisory given  Plan Discussed with: CRNA and Surgeon  Anesthesia Plan Comments:         Anesthesia Quick Evaluation

## 2018-08-29 NOTE — Anesthesia Postprocedure Evaluation (Signed)
Anesthesia Post Note  Patient: Ardean LarsenVivian J Pileggi  Procedure(s) Performed: TRACHELECTOMY (N/A ) ANTERIOR (CYSTOCELE) AND POSTERIOR REPAIR (RECTOCELE) (N/A ) LAPAROSCOPIC BILATERAL SALPINGO OOPHORECTOMY (Bilateral ) LAPAROSCOPIC LYSIS OF ADHESIONS RETROGRADE FILLING OF THE BLADDER  Patient location during evaluation: PACU Anesthesia Type: General Level of consciousness: awake and alert and oriented Pain management: pain level controlled Vital Signs Assessment: post-procedure vital signs reviewed and stable Respiratory status: spontaneous breathing Cardiovascular status: blood pressure returned to baseline Anesthetic complications: no     Last Vitals:  Vitals:   08/29/18 1145 08/29/18 1215  BP: 119/85 118/89  Pulse: 70 74  Resp: 14 16  Temp:  37 C  SpO2: 99% 95%    Last Pain:  Vitals:   08/29/18 1230  TempSrc:   PainSc: 3                  Casimer Russett

## 2018-08-29 NOTE — Brief Op Note (Signed)
08/29/2018  10:40 AM  PATIENT:  Christie Cox  57 y.o. female  PRE-OPERATIVE DIAGNOSIS:  pelvic organ prolapse  POST-OPERATIVE DIAGNOSIS:  pelvic organ prolapse Pelvic adhesions PROCEDURE:  Procedure(s): TRACHELECTOMY (N/A) ANTERIOR (CYSTOCELE) AND POSTERIOR REPAIR (RECTOCELE) (N/A) LAPAROSCOPIC BILATERAL SALPINGO OOPHORECTOMY (Bilateral) LAPAROSCOPIC LYSIS OF ADHESIONS RETROGRADE FILLING OF THE BLADDER  SURGEON:  Surgeon(s) and Role:    * Atasha Colebank, Ihor Austinhomas J, MD - Primary    * Christeen DouglasBeasley, Bethany, MD - Assisting  PHYSICIAN ASSISTANT none ASSISTANTS: none   ANESTHESIA:   general  EBL:  10 mL   BLOOD ADMINISTERED:none  DRAINS: Urinary Catheter (Foley)   Packing : vaginal   LOCAL MEDICATIONS USED:  MARCAINE    and LIDOCAINE   SPECIMEN:  Source of Specimen:  bilateral tubes and ovaries and cervix  DISPOSITION OF SPECIMEN:  PATHOLOGY  COUNTS:  YES  TOURNIQUET:  * No tourniquets in log *  DICTATION: .Other Dictation: Dictation Number verbal  PLAN OF CARE: Admit for overnight observation  PATIENT DISPOSITION:  PACU - hemodynamically stable.   Delay start of Pharmacological VTE agent (>24hrs) due to surgical blood loss or risk of bleeding: not applicable

## 2018-08-29 NOTE — Progress Notes (Signed)
Pt interviewed . NPO . Amlodipine and prilosec taken . Pt notified me that she had a sister that passed from breast and ovarian cancer . Therefor I have recommended that I also perform a bilateral sapingoophorectomy . Consent resigned . Marland Kitchen. Labs reviewed . All questions answered

## 2018-08-29 NOTE — Anesthesia Post-op Follow-up Note (Signed)
Anesthesia QCDR form completed.        

## 2018-08-29 NOTE — Transfer of Care (Signed)
Immediate Anesthesia Transfer of Care Note  Patient: Christie Cox  Procedure(s) Performed: TRACHELECTOMY (N/A ) ANTERIOR (CYSTOCELE) AND POSTERIOR REPAIR (RECTOCELE) (N/A ) LAPAROSCOPIC BILATERAL SALPINGO OOPHORECTOMY (Bilateral ) LAPAROSCOPIC LYSIS OF ADHESIONS RETROGRADE FILLING OF THE BLADDER  Patient Location: PACU  Anesthesia Type:General  Level of Consciousness: sedated  Airway & Oxygen Therapy: Patient Spontanous Breathing and Patient connected to face mask oxygen  Post-op Assessment: Report given to RN and Post -op Vital signs reviewed and stable  Post vital signs: Reviewed and stable  Last Vitals:  Vitals Value Taken Time  BP 114/75 08/29/2018 10:46 AM  Temp 36.1 C 08/29/2018 10:45 AM  Pulse 77 08/29/2018 10:52 AM  Resp 27 08/29/2018 10:52 AM  SpO2 100 % 08/29/2018 10:52 AM  Vitals shown include unvalidated device data.  Last Pain:  Vitals:   08/29/18 0629  TempSrc: Oral  PainSc: 0-No pain         Complications: No apparent anesthesia complications

## 2018-08-29 NOTE — Op Note (Signed)
NAME: Christie Cox, LOUK MEDICAL RECORD WU:[id number] ACCOUNT [id number] DATE OF BIRTH:05-30-1961 FACILITY: [facility] LOCATION: [facility] PHYSICIAN:THOMAS Cloyde Reams, MD  OPERATIVE REPORT  DATE OF PROCEDURE:  08/29/2018  PREOPERATIVE DIAGNOSES: 1.  Cervical descensus. 2.  Cystocele grade II to III.   3.  Rectocele grade II. 4.  Family history of breast and ovarian cancer.  POSTOPERATIVE DIAGNOSES: 1.  Pelvic adhesions. 2.  Cervical descent cystocele. 3.  Grade II cystocele. 4.  Grade II rectocele. 5.  Family history of breast and ovarian cancer.  PROCEDURES: 1.  Laparoscopic pelvic adhesiolysis. 2.  Laparoscopic assisted cervical trachelectomy. 3.  Bilateral salpingo-oophorectomy. 4.  Retrograde filling of the bladder. 5.  Anterior and posterior colporrhaphy. 6.  Perineoplasty.  SURGEON:  Jennell Corner, MD.  FIRST ASSISTANT:  Dalbert Garnet.  ANESTHESIA:  General endotracheal anesthesia.  INDICATIONS:  A 57 year old female with pelvic relaxation noted on examination.  Patient is status post a supracervical hysterectomy in the past.  The patient has a sister that died from breast and ovarian cancer within the last year.  FINDINGS:  The patient had multiple adhesions in the lower pelvic area including the pelvic sidewall bilaterally.  Ovaries and fallopian tubes appeared normal.  DESCRIPTION OF PROCEDURE:  After general endotracheal anesthesia, the patient was placed in the dorsal supine position with the legs in the Alamo stirrups.  The patient's abdomen, perineum and vagina were prepped and draped in normal sterile fashion.   Timeout was performed.  The patient did receive 2 grams IV Ancef prior to commencement of the case.  Straight catheterization of the bladder yielded 150 mL clear urine.  Attention was directed to the patient's abdomen and a 5 mm infraumbilical incision  was made after injecting with 0.5% Marcaine.  The 5 mm laparoscope was advanced into the abdominal  cavity under direct visualization using the Optiview cannula.  Initial impression demonstrated multiple filmy adhesions in the lower pelvis.  A second port  site was placed in the left lower quadrant 3 cm medial to the left anterior iliac spine and under direct visualization, a 5 mm trocar was advanced.  A third port site was placed in the right lower quadrant, again 3 cm medial to the right anterior iliac  spine.  A Harmonic scalpel was brought up to the operative field and several filmy adhesions were removed to free up the cervix and the posterior cul-de-sac.  Attention was then directed to the left adnexa and the infundibulopelvic ligament was ligated  and the ovary with the attached fallopian tube were removed without difficulty.  Good hemostasis was noted.  Similar procedure was repeated on the patient's right infundibulopelvic ligament and the right ovary and fallopian tube were removed without  difficulty.  Both of the fallopian tube and ovaries were attached to one grasper, which was used to push the structures through the vaginal cuff once the cervix was removed.  Attention was then directed vaginally.  A weighted speculum was placed in the  posterior vagina.  The cervix was grasped with 2 thyroid tenacula and was circumferentially injected with 1% lidocaine with 1:100,000 epinephrine.  A direct posterior colpotomy incision was made.  Upon entry into the posterior cul-de-sac, the uterosacral  ligaments were bilaterally clamped, transected, suture ligated and tagged for later identification.  Anterior cervix was incised with the Bovie, and the cardinal ligaments were then bilaterally clamped, transected, suture ligated with 0 Vicryl suture in  sequential bites, ultimately allowed for the removal of the cervical stump.  The pedicles were  hemostatic.  A broach.  A retrograde filling of the bladder with normal saline and methylene blue was then performed to ensure there was no defect in the  bladder.   No spillage of fluid was noted after instillation of 200 mL.  The bladder was then re-drained.  The fallopian tubes and ovaries were then retrieved through the vaginal cuff.  Vaginal cuff was then closed with a running 0 Vicryl suture.  Attention was then directed to the anterior vagina, which was grasped in the  midportion with 2 Allis clamps.  Hydrodissection was used with 1% lidocaine with 1:100,000 epinephrine and a central incision was made from the vaginal cuff to approximately 1 cm inferior to the urethral meatus.  The bladder was dissected free from the  vaginal tissues.  Horizontal mattress sutures were then used with 2-0 Vicryl suture to close the cystocele defect.  The vaginal tissues were then trimmed and the anterior vaginal tissues were closed with a running 0 Vicryl suture.  Similar procedure was  repeated posteriorly.  A triangle shaped incision was made at the hymenal ring and through the perineal body and subvaginal tissues were injected centrally with 1% lidocaine with 1:100,000 epinephrine.  The posterior vaginal vault was opened so  essentially and the rectocele was dissected free from the vaginal tissues, healthy endopelvic fascia was identified and using mattress sutures, the defect was closed.  Vaginal tissues were trimmed and the vaginal tissues were closed with a running 0  Vicryl suture.  Perineoplasty was then performed with a crown suture of 0 Vicryl suture with reapproximation of the perineal body and subcuticular skin was closed with a 4-0 Vicryl suture.  Good cosmetic effect.  The vagina could accommodate 2 fingers  without difficulty, given.  Given there was a small amount of oozing.  The vagina was then packed with vaginal packing soaked in Premarin cream.  Foley catheter was placed to aid in additional drainage.  COMPLICATIONS:  There were no complications.  ESTIMATED BLOOD LOSS:  10 mL.  INTRAOPERATIVE FLUIDS:  600 mL.  URINE OUTPUT:  250 mL.    The patient  tolerated the procedure well and was taken to recovery room in good condition.  AN/NUANCE  D:08/29/2018 T:08/29/2018 JOB:[id number]

## 2018-08-29 NOTE — Anesthesia Procedure Notes (Signed)
Procedure Name: Intubation Date/Time: 08/29/2018 7:55 AM Performed by: Hedda Slade, CRNA Pre-anesthesia Checklist: Patient identified, Patient being monitored, Timeout performed, Emergency Drugs available and Suction available Patient Re-evaluated:Patient Re-evaluated prior to induction Oxygen Delivery Method: Circle system utilized Preoxygenation: Pre-oxygenation with 100% oxygen Induction Type: IV induction Ventilation: Mask ventilation without difficulty Laryngoscope Size: Mac and 3 Grade View: Grade II Tube type: Oral Tube size: 7.0 mm Number of attempts: 1 Airway Equipment and Method: Stylet Placement Confirmation: ETT inserted through vocal cords under direct vision,  positive ETCO2 and breath sounds checked- equal and bilateral Secured at: 21 cm Tube secured with: Tape Dental Injury: Teeth and Oropharynx as per pre-operative assessment

## 2018-08-29 NOTE — Progress Notes (Signed)
Day of Surgery Procedure(s) (LRB): TRACHELECTOMY (N/A) ANTERIOR (CYSTOCELE) AND POSTERIOR REPAIR (RECTOCELE) (N/A) LAPAROSCOPIC BILATERAL SALPINGO OOPHORECTOMY (Bilateral) LAPAROSCOPIC LYSIS OF ADHESIONS RETROGRADE FILLING OF THE BLADDER  Subjective: Patient reports pain ok  Objective: I have reviewed patient's vital signs.+ glucose    Assessment: s/p Procedure(s): TRACHELECTOMY (N/A) ANTERIOR (CYSTOCELE) AND POSTERIOR REPAIR (RECTOCELE) (N/A) LAPAROSCOPIC BILATERAL SALPINGO OOPHORECTOMY (Bilateral) LAPAROSCOPIC LYSIS OF ADHESIONS RETROGRADE FILLING OF THE BLADDER: stable  Plan: Advance diet, add metformin back 500 mg bid  Amlodipine 5 mg in am   pull packing and foley and voiding trail in am   LOS: 0 days    Christie Cox 08/29/2018, 5:00 PM

## 2018-08-30 ENCOUNTER — Encounter: Payer: Self-pay | Admitting: Obstetrics and Gynecology

## 2018-08-30 DIAGNOSIS — N8111 Cystocele, midline: Secondary | ICD-10-CM | POA: Diagnosis not present

## 2018-08-30 LAB — CBC
HCT: 34.5 % — ABNORMAL LOW (ref 36.0–46.0)
HEMOGLOBIN: 11.6 g/dL — AB (ref 12.0–15.0)
MCH: 32.4 pg (ref 26.0–34.0)
MCHC: 33.6 g/dL (ref 30.0–36.0)
MCV: 96.4 fL (ref 80.0–100.0)
NRBC: 0 % (ref 0.0–0.2)
PLATELETS: 231 10*3/uL (ref 150–400)
RBC: 3.58 MIL/uL — AB (ref 3.87–5.11)
RDW: 12.3 % (ref 11.5–15.5)
WBC: 8.1 10*3/uL (ref 4.0–10.5)

## 2018-08-30 LAB — BASIC METABOLIC PANEL
ANION GAP: 6 (ref 5–15)
BUN: 13 mg/dL (ref 6–20)
CHLORIDE: 108 mmol/L (ref 98–111)
CO2: 27 mmol/L (ref 22–32)
Calcium: 8.8 mg/dL — ABNORMAL LOW (ref 8.9–10.3)
Creatinine, Ser: 0.6 mg/dL (ref 0.44–1.00)
GFR calc Af Amer: >60 mL/min (ref >60)
Glucose, Bld: 107 mg/dL — ABNORMAL HIGH (ref 70–99)
POTASSIUM: 3.9 mmol/L (ref 3.5–5.1)
SODIUM: 141 mmol/L (ref 135–145)

## 2018-08-30 LAB — GLUCOSE, CAPILLARY
GLUCOSE-CAPILLARY: 103 mg/dL — AB (ref 70–99)
GLUCOSE-CAPILLARY: 82 mg/dL (ref 70–99)
GLUCOSE-CAPILLARY: 92 mg/dL (ref 70–99)

## 2018-08-30 MED ORDER — HYDROCODONE-ACETAMINOPHEN 5-325 MG PO TABS: 1.0000 | ORAL_TABLET | ORAL | 0 refills | Status: AC | PRN

## 2018-08-30 MED ORDER — ONDANSETRON HCL 4 MG PO TABS: 4.0000 mg | ORAL_TABLET | Freq: Four times a day (QID) | ORAL | 0 refills | Status: AC | PRN

## 2018-08-30 MED ORDER — IBUPROFEN 600 MG PO TABS: 600.0000 mg | ORAL_TABLET | Freq: Four times a day (QID) | ORAL | 0 refills | Status: AC | PRN

## 2018-08-30 MED ORDER — DOCUSATE SODIUM 100 MG PO CAPS: 100.0000 mg | ORAL_CAPSULE | Freq: Every day | ORAL | 2 refills | Status: AC | PRN

## 2018-08-30 NOTE — Plan of Care (Signed)
Vs stable; tolerating regular diet;  has now advanced to taking percocet and motrin for pain control; pt has not needed iv morphine for pain this shift; output good; foley and vaginal packing removed this shift at 0600 per MD order

## 2018-08-30 NOTE — Progress Notes (Signed)
Pt was able to urinate clear, yellow urine. After void, bladder was scanned to reveal volume of 29mL remaining. Scant vaginal bleeding present. Will continue to monitor.

## 2018-08-30 NOTE — Progress Notes (Signed)
Provided and reviewed discharge paperwork and prescriptions. Teach back method utilized, pt verbalized understanding as well. Follow up appointment provided. Discharged home with husband to transport. Hospital volunteer to take to visitor entrance via wheel chair.

## 2018-08-30 NOTE — Progress Notes (Signed)
Nurse tech got pt's capillary glucose at this time; pt's CBG at 0030 is 82; RN saw this number on the glucometer BUT the glucometer didn't send it to pt's chart

## 2018-08-30 NOTE — Discharge Summary (Signed)
Physician Discharge Summary  Patient ID: Christie Cox MRN: [id number] DOB/AGE: October 11, 1961 57 y.o.  Admit date: 08/29/2018 Discharge date: 08/30/2018  Admission Diagnoses:pelvic relaxation   Discharge Diagnoses: same Active Problems:   Post-operative state   Discharged Condition: good  Hospital Course: pt underwent an uncomplicated L/S LOA , cervical trachelectomy and Anterior and posterior repair . Voiding trial passed POD#1 no problems  Consults: None  Significant Diagnostic Studies: labs:  Results for orders placed or performed during the hospital encounter of 08/29/18 (from the past 24 hour(s))  Glucose, capillary     Status: Abnormal   Collection Time: 08/29/18 11:17 AM  Result Value Ref Range   Glucose-Capillary 157 (H) 70 - 99 mg/dL  Glucose, capillary     Status: Abnormal   Collection Time: 08/29/18  4:47 PM  Result Value Ref Range   Glucose-Capillary 171 (H) 70 - 99 mg/dL  Glucose, capillary     Status: Abnormal   Collection Time: 08/29/18  8:41 PM  Result Value Ref Range   Glucose-Capillary 135 (H) 70 - 99 mg/dL  Glucose, capillary     Status: None   Collection Time: 08/30/18  4:24 AM  Result Value Ref Range   Glucose-Capillary 92 70 - 99 mg/dL  CBC     Status: Abnormal   Collection Time: 08/30/18  6:14 AM  Result Value Ref Range   WBC 8.1 4.0 - 10.5 K/uL   RBC 3.58 (L) 3.87 - 5.11 MIL/uL   Hemoglobin 11.6 (L) 12.0 - 15.0 g/dL   HCT 40.9 (L) 81.1 - 91.4 %   MCV 96.4 80.0 - 100.0 fL   MCH 32.4 26.0 - 34.0 pg   MCHC 33.6 30.0 - 36.0 g/dL   RDW 78.2 95.6 - 21.3 %   Platelets 231 150 - 400 K/uL   nRBC 0.0 0.0 - 0.2 %  Basic metabolic panel     Status: Abnormal   Collection Time: 08/30/18  6:14 AM  Result Value Ref Range   Sodium 141 135 - 145 mmol/L   Potassium 3.9 3.5 - 5.1 mmol/L   Chloride 108 98 - 111 mmol/L   CO2 27 22 - 32 mmol/L   Glucose, Bld 107 (H) 70 - 99 mg/dL   BUN 13 6 - 20 mg/dL   Creatinine, Ser 0.86 0.44 - 1.00 mg/dL   Calcium 8.8  (L) 8.9 - 10.3 mg/dL   GFR calc non Af Amer >60 >60 mL/min   GFR calc Af Amer >60 >60 mL/min   Anion gap 6 5 - 15  Glucose, capillary     Status: Abnormal   Collection Time: 08/30/18  8:10 AM  Result Value Ref Range   Glucose-Capillary 103 (H) 70 - 99 mg/dL     Treatments:surgery as above Discharge Exam: Blood pressure 123/89, pulse 67, temperature 98 F (36.7 C), temperature source Oral, resp. rate 18, height 5\' 4"  (1.626 m), weight 82 kg, SpO2 100 %. General appearance: alert and cooperative Resp: clear to auscultation bilaterally Cardio: regular rate and rhythm, S1, S2 normal, no murmur, click, rub or gallop GI: soft, non-tender; bowel sounds normal; no masses,  no organomegaly Packing pulled out POD#1 Disposition: Discharge disposition: 01-Home or Self Care       Discharge Instructions    Call MD for:   Complete by:  As directed    Heavy vaginal bleeding   Call MD for:  difficulty breathing, headache or visual disturbances   Complete by:  As directed    Call  MD for:  extreme fatigue   Complete by:  As directed    Call MD for:  hives   Complete by:  As directed    Call MD for:  persistant dizziness or light-headedness   Complete by:  As directed    Call MD for:  persistant nausea and vomiting   Complete by:  As directed    Call MD for:  redness, tenderness, or signs of infection (pain, swelling, redness, odor or green/yellow discharge around incision site)   Complete by:  As directed    Call MD for:  severe uncontrolled pain   Complete by:  As directed    Call MD for:  temperature >100.4   Complete by:  As directed    Diet - low sodium heart healthy   Complete by:  As directed    Increase activity slowly   Complete by:  As directed      Allergies as of 08/30/2018      Reactions   Beta Adrenergic Blockers Other (See Comments)   Lack of energy   Triamterene-hctz    malaise      Medication List    TAKE these medications   acetaminophen 500 MG  tablet Commonly known as:  TYLENOL Take 500 mg by mouth daily as needed for moderate pain or headache.   acyclovir ointment 5 % Commonly known as:  ZOVIRAX Apply 1 application topically 5 (five) times daily as needed (blisters).   amLODipine 5 MG tablet Commonly known as:  NORVASC Take 5 mg by mouth daily.   docusate sodium 100 MG capsule Commonly known as:  COLACE Take 1 capsule (100 mg total) by mouth daily as needed.   fenofibrate 48 MG tablet Commonly known as:  TRICOR Take 48 mg by mouth daily.   glimepiride 2 MG tablet Commonly known as:  AMARYL Take 2 mg by mouth daily with breakfast.   HYDROcodone-acetaminophen 5-325 MG tablet Commonly known as:  NORCO/VICODIN Take 1 tablet by mouth every 4 (four) hours as needed for moderate pain.   ibuprofen 600 MG tablet Commonly known as:  ADVIL,MOTRIN Take 1 tablet (600 mg total) by mouth every 6 (six) hours as needed (mild pain).   losartan-hydrochlorothiazide 100-12.5 MG tablet Commonly known as:  HYZAAR Take 1 tablet by mouth daily.   metFORMIN 500 MG tablet Commonly known as:  GLUCOPHAGE Take 500 mg by mouth 2 (two) times daily.   omeprazole 20 MG capsule Commonly known as:  PRILOSEC Take 20 mg by mouth 2 (two) times daily before a meal.   ondansetron 4 MG tablet Commonly known as:  ZOFRAN Take 1 tablet (4 mg total) by mouth every 6 (six) hours as needed for nausea.   PROVENTIL HFA 108 (90 Base) MCG/ACT inhaler Generic drug:  albuterol Inhale 2 puffs into the lungs every 4 (four) hours as needed for wheezing or shortness of breath.      Follow-up Information    Annetta Deiss, Ihor Austinhomas J, MD Follow up in 2 week(s).   Specialty:  Obstetrics and Gynecology Why:  post op  Contact information: [address] [facility] [place] [address] [phone]           Signed: Ihor Austinhomas J Kattie Santoyo 08/30/2018, 10:04 AM

## 2018-08-31 LAB — SURGICAL PATHOLOGY

## 2018-09-01 LAB — BPAM RBC
BLOOD PRODUCT EXPIRATION DATE: 201911292359
Blood Product Expiration Date: 201911292359
UNIT TYPE AND RH: 5100
Unit Type and Rh: 5100

## 2018-09-01 LAB — TYPE AND SCREEN
ABO/RH(D): A POS
Antibody Screen: POSITIVE
UNIT DIVISION: 0
UNIT DIVISION: 0

## 2018-09-27 ENCOUNTER — Other Ambulatory Visit: Payer: Self-pay | Admitting: Internal Medicine

## 2018-09-27 DIAGNOSIS — Z1231 Encounter for screening mammogram for malignant neoplasm of breast: Secondary | ICD-10-CM

## 2018-11-11 ENCOUNTER — Ambulatory Visit: Payer: BLUE CROSS/BLUE SHIELD | Admitting: Urology

## 2019-04-21 ENCOUNTER — Encounter: Payer: Self-pay | Admitting: *Deleted

## 2019-09-26 ENCOUNTER — Other Ambulatory Visit: Payer: Self-pay | Admitting: Internal Medicine

## 2019-09-26 DIAGNOSIS — Z1231 Encounter for screening mammogram for malignant neoplasm of breast: Secondary | ICD-10-CM

## 2019-11-03 IMAGING — CT CT ABD-PEL WO/W CM
3 of 12 series · 12 of 46 positions shown, 18 images · IV contrast (iopamidol)
Comparison: None.

CLINICAL DATA: Microhematuria.

EXAM:
CT ABDOMEN AND PELVIS WITHOUT AND WITH CONTRAST
TECHNIQUE: Multidetector CT imaging of the abdomen and pelvis was performed
following the standard protocol before and following the bolus
administration of intravenous contrast.
CONTRAST:  125mL [id number] IOPAMIDOL (S198IX-O00) INJECTION 61%

[Series 2: axial pre · axial · non-contrast · 0.84mm/px · z∈[-142,+52]mm · 4 of 93 slices shown]
[im 14/93  soft-tissue]
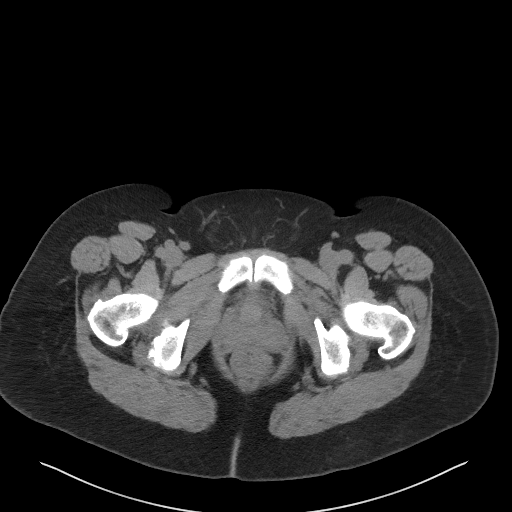
[im 27/93  soft-tissue]
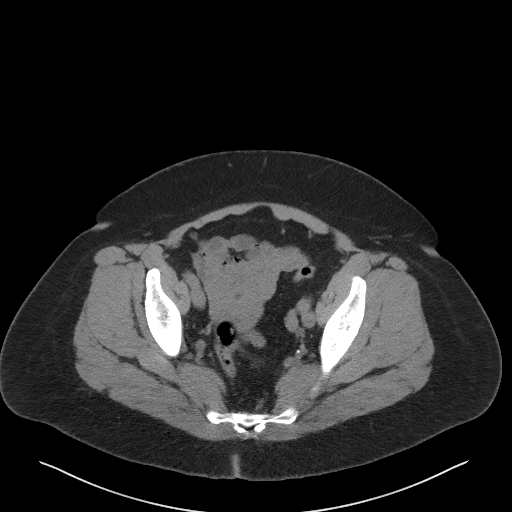
[im 40/93  soft-tissue]
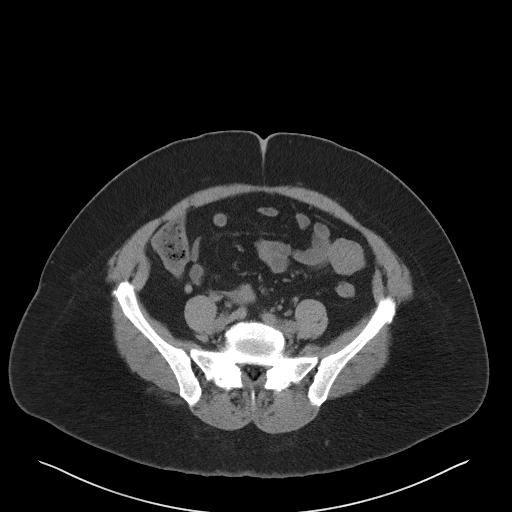
[im 53/93  soft-tissue]
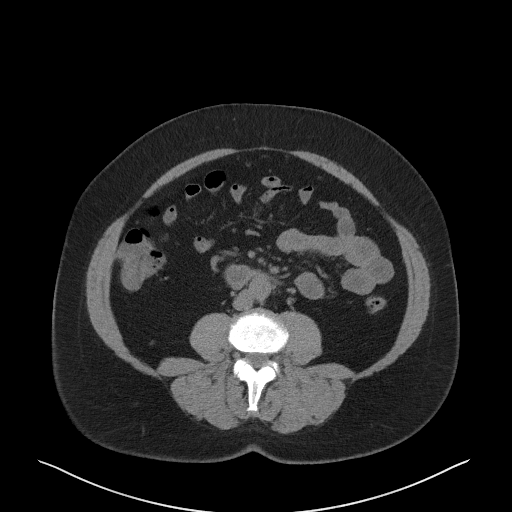

[Series 4: coronal pre · coronal · non-contrast · 0.82mm/px · 2 of 100 slices shown, 3 images]
[im 34/100  soft-tissue]
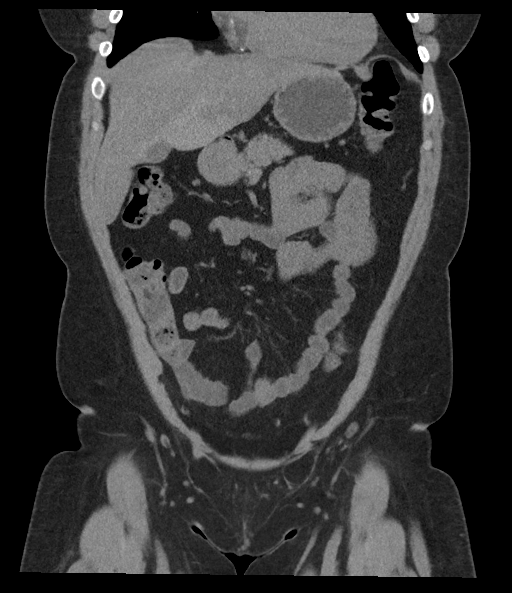
[im 34/100  bone]
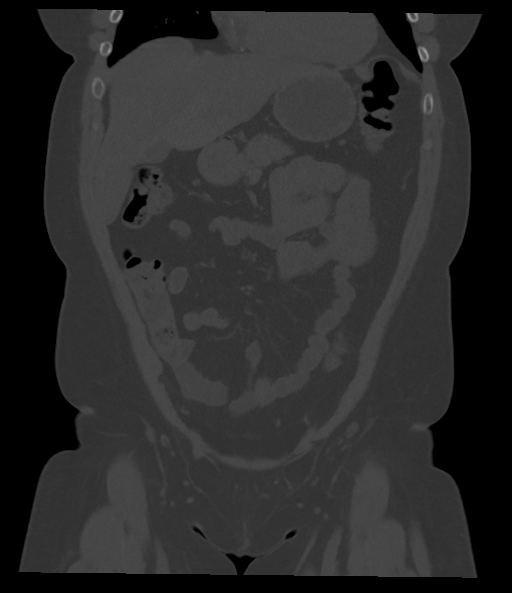
[im 67/100  soft-tissue]
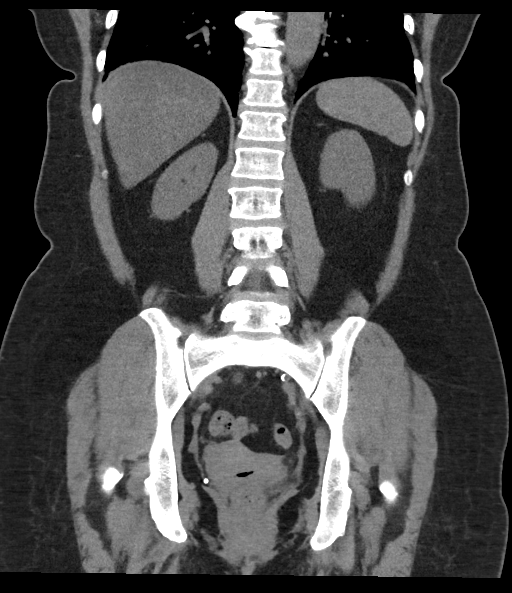

[Series 13: axial delay · axial · delayed · 0.87mm/px · z∈[-226,+134]mm · 6 of 102 slices shown, 11 images]
[im 15/102  soft-tissue]
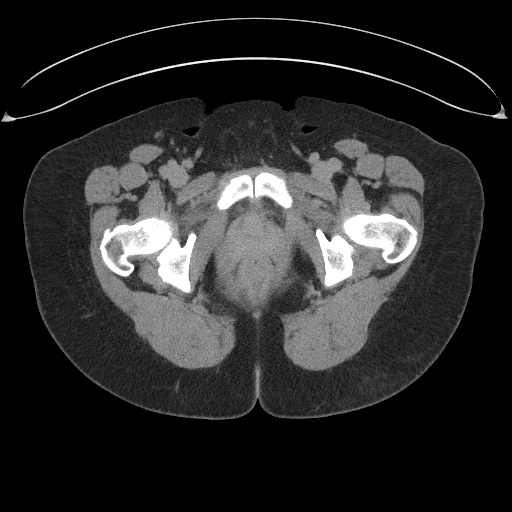
[im 15/102  bone]
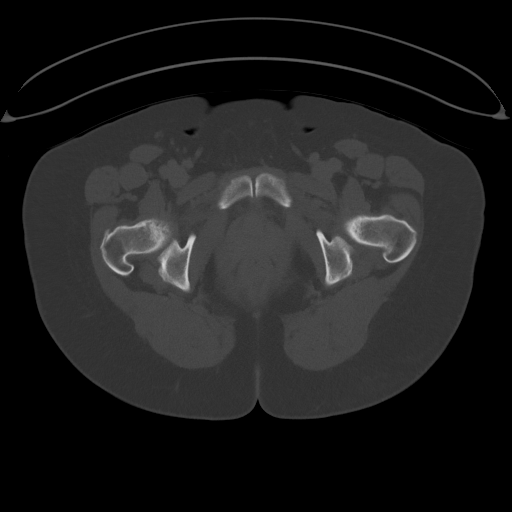
[im 29/102  soft-tissue]
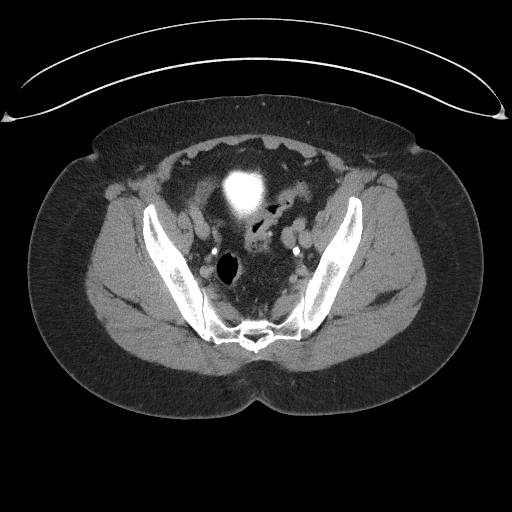
[im 44/102  soft-tissue]
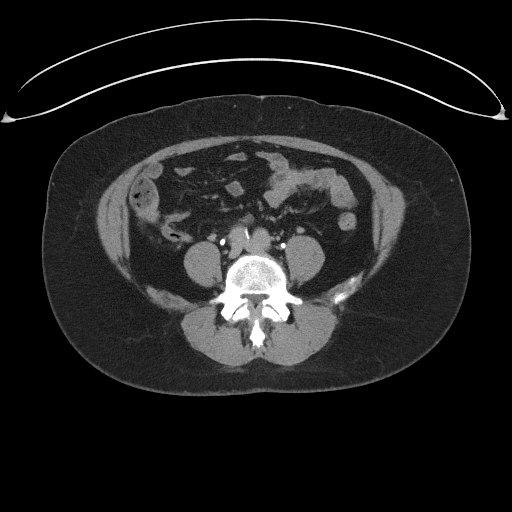
[im 44/102  lung]
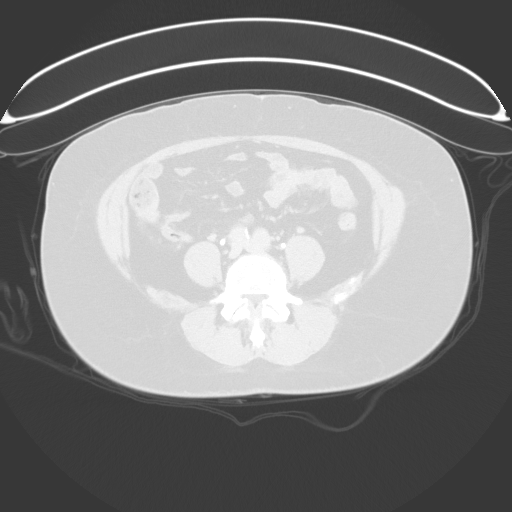
[im 58/102  soft-tissue]
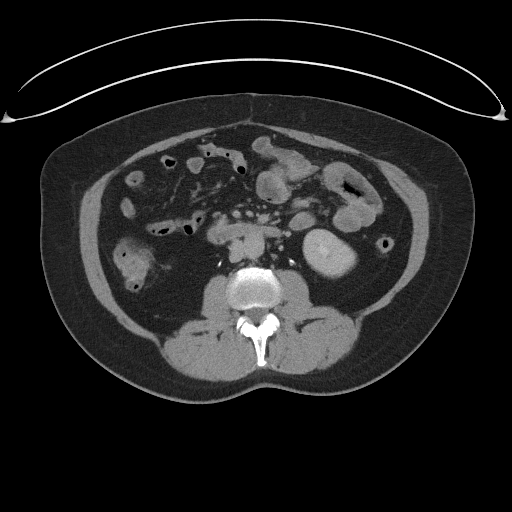
[im 58/102  lung]
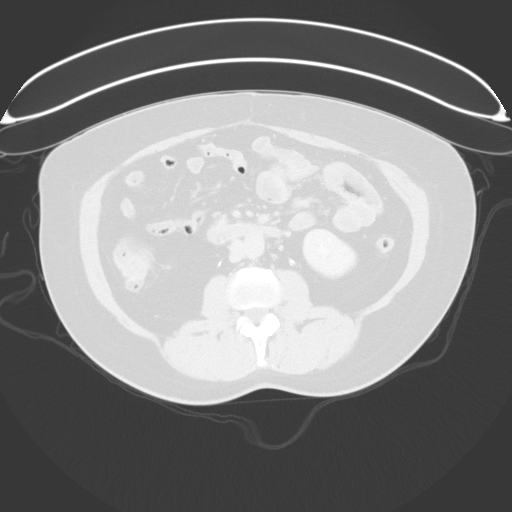
[im 73/102  soft-tissue]
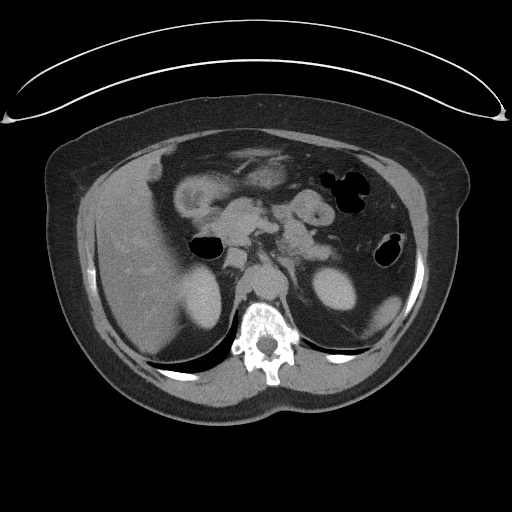
[im 73/102  lung]
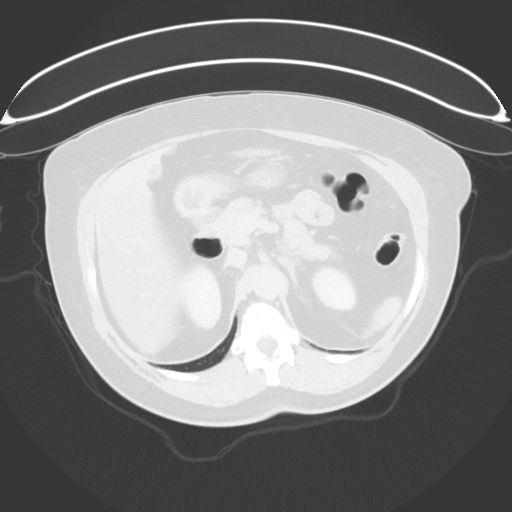
[im 87/102  soft-tissue]
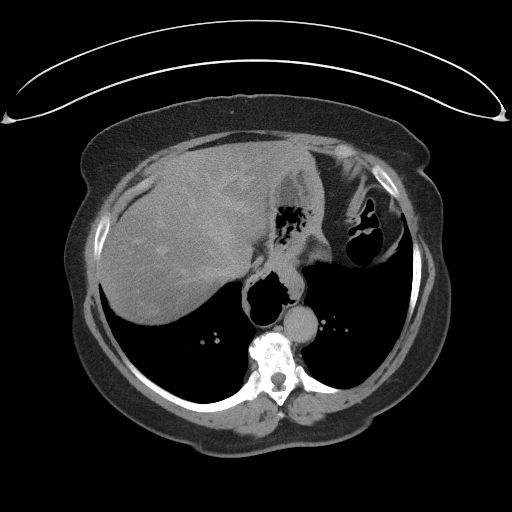
[im 87/102  lung]
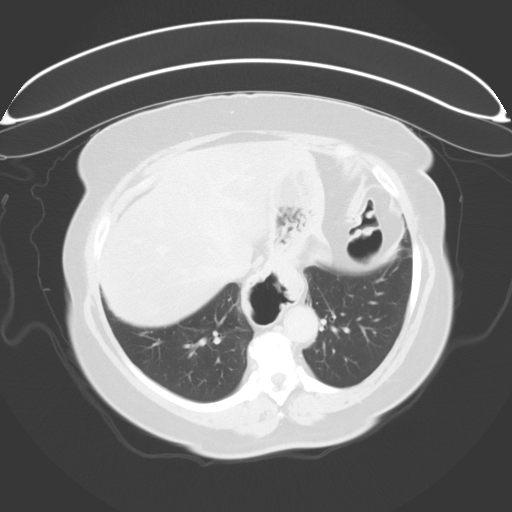

[12 of 46 positions shown; findings below may reference images not displayed]

FINDINGS: Lower chest: No significant pulmonary nodules or acute consolidative
airspace disease. Coronary atherosclerosis.

Hepatobiliary: Moderate heterogeneous hepatic steatosis. No definite
liver surface irregularity. No liver mass. Normal gallbladder with
no radiopaque cholelithiasis. No biliary ductal dilatation.

Pancreas: Normal, with no mass or duct dilation.

Spleen: Normal size. No mass.

Adrenals/Urinary Tract: Normal adrenals. No renal stones. No
hydronephrosis. Normal caliber ureters, with no ureteral stones. No
renal cortical masses. On delayed imaging, there is no urothelial
wall thickening and there are no filling defects in the opacified
portions of the bilateral collecting systems or ureters. No bladder
stones, masses, diverticula or definite wall thickening.

Stomach/Bowel: Small to moderate hiatal hernia. Otherwise normal
nondistended stomach. Normal caliber small bowel with no small bowel
wall thickening. Normal appendix. Mild left colonic diverticulosis,
with no large bowel wall thickening or pericolonic fat stranding.

Vascular/Lymphatic: Atherosclerotic nonaneurysmal abdominal aorta.
Patent portal, splenic, hepatic and renal veins. No pathologically
enlarged lymph nodes in the abdomen or pelvis.

Reproductive: Status post hysterectomy. Mild soft tissue fullness at
the hysterectomy margin (series 7/image 73) presumably represents
remnant cervical tissue. No adnexal mass.

Other: No pneumoperitoneum, ascites or focal fluid collection.

Musculoskeletal: No aggressive appearing focal osseous lesions. Mild
thoracolumbar spondylosis.
IMPRESSION: 1. No urolithiasis. No renal cortical masses. No evidence of
urothelial lesions.
2. Mild soft tissue fullness at the hysterectomy margin, presumably
due to a history of supracervical hysterectomy, recommend
correlation with surgical details.
3. Chronic findings include: Aortic Atherosclerosis ([id number]).
Coronary atherosclerosis. Small to moderate hiatal hernia. Mild left
colonic diverticulosis. Heterogeneous hepatic steatosis.

## 2019-12-10 ENCOUNTER — Ambulatory Visit: Payer: Self-pay | Attending: Internal Medicine

## 2019-12-10 ENCOUNTER — Other Ambulatory Visit: Payer: Self-pay

## 2019-12-10 DIAGNOSIS — Z23 Encounter for immunization: Secondary | ICD-10-CM | POA: Insufficient documentation

## 2019-12-10 NOTE — Progress Notes (Signed)
   Covid-19 Vaccination Clinic  Name:  DARREN NODAL    MRN: [id number] DOB: 03-18-1961  12/10/2019  Ms. Hammac was observed post Covid-19 immunization for 15 minutes without incidence. She was provided with Vaccine Information Sheet and instruction to access the V-Safe system.   Ms. Jillson was instructed to call 911 with any severe reactions post vaccine: Marland Kitchen Difficulty breathing  . Swelling of your face and throat  . A fast heartbeat  . A bad rash all over your body  . Dizziness and weakness    Immunizations Administered    Name Date Dose VIS Date Route   Pfizer COVID-19 Vaccine 12/10/2019  4:28 PM 0.3 mL 09/22/2019 Intramuscular   Manufacturer: [employer], [employer]   Lot: [id number]   NDC: [address]-7

## 2020-01-09 ENCOUNTER — Ambulatory Visit: Payer: Self-pay | Attending: Internal Medicine

## 2020-01-09 DIAGNOSIS — Z23 Encounter for immunization: Secondary | ICD-10-CM

## 2020-01-09 NOTE — Progress Notes (Signed)
   Covid-19 Vaccination Clinic  Name:  LOA IDLER    MRN: [id number] DOB: [birth year]  01/09/2020  Ms. Horine was observed post Covid-19 immunization for 15 minutes without incident. She was provided with Vaccine Information Sheet and instruction to access the V-Safe system.   Ms. Siebel was instructed to call 911 with any severe reactions post vaccine: Marland Kitchen Difficulty breathing  . Swelling of face and throat  . A fast heartbeat  . A bad rash all over body  . Dizziness and weakness   Immunizations Administered    Name Date Dose VIS Date Route   Pfizer COVID-19 Vaccine 01/09/2020  4:48 PM 0.3 mL 09/22/2019 Intramuscular   Manufacturer: [employer], [employer]   Lot: [id number]   NDC: [address]-5

## 2021-04-04 ENCOUNTER — Other Ambulatory Visit: Payer: Self-pay | Admitting: Internal Medicine

## 2021-04-04 DIAGNOSIS — Z1231 Encounter for screening mammogram for malignant neoplasm of breast: Secondary | ICD-10-CM

## 2022-10-06 ENCOUNTER — Other Ambulatory Visit: Payer: Self-pay | Admitting: Internal Medicine

## 2022-10-06 DIAGNOSIS — Z1231 Encounter for screening mammogram for malignant neoplasm of breast: Secondary | ICD-10-CM

## 2023-03-02 ENCOUNTER — Ambulatory Visit
Admission: RE | Admit: 2023-03-02 | Discharge: 2023-03-02 | Disposition: A | Discharge status: Home / Self Care | Payer: BC Managed Care – PPO | Source: Ambulatory Visit | Attending: Internal Medicine | Admitting: Internal Medicine

## 2023-03-02 DIAGNOSIS — Z1231 Encounter for screening mammogram for malignant neoplasm of breast: Secondary | ICD-10-CM
